# Patient Record
Sex: Female | Born: 1950
Health system: Southern US, Community
[De-identification: ages and names within clinical notes are randomized; demographics above are authoritative.]

## PROBLEM LIST (undated history)

## (undated) DIAGNOSIS — T8859XA Other complications of anesthesia, initial encounter: Secondary | ICD-10-CM

## (undated) DIAGNOSIS — E785 Hyperlipidemia, unspecified: Secondary | ICD-10-CM

## (undated) DIAGNOSIS — F39 Unspecified mood [affective] disorder: Secondary | ICD-10-CM

## (undated) DIAGNOSIS — M1990 Unspecified osteoarthritis, unspecified site: Secondary | ICD-10-CM

## (undated) DIAGNOSIS — R0602 Shortness of breath: Secondary | ICD-10-CM

## (undated) DIAGNOSIS — F32A Depression, unspecified: Secondary | ICD-10-CM

## (undated) DIAGNOSIS — J069 Acute upper respiratory infection, unspecified: Secondary | ICD-10-CM

## (undated) DIAGNOSIS — K219 Gastro-esophageal reflux disease without esophagitis: Secondary | ICD-10-CM

## (undated) DIAGNOSIS — E2839 Other primary ovarian failure: Secondary | ICD-10-CM

## (undated) DIAGNOSIS — Z8719 Personal history of other diseases of the digestive system: Secondary | ICD-10-CM

## (undated) DIAGNOSIS — Z720 Tobacco use: Secondary | ICD-10-CM

## (undated) DIAGNOSIS — T4145XA Adverse effect of unspecified anesthetic, initial encounter: Secondary | ICD-10-CM

## (undated) DIAGNOSIS — I739 Peripheral vascular disease, unspecified: Secondary | ICD-10-CM

## (undated) DIAGNOSIS — J449 Chronic obstructive pulmonary disease, unspecified: Secondary | ICD-10-CM

## (undated) DIAGNOSIS — F17200 Nicotine dependence, unspecified, uncomplicated: Secondary | ICD-10-CM

## (undated) DIAGNOSIS — R51 Headache: Secondary | ICD-10-CM

## (undated) DIAGNOSIS — F419 Anxiety disorder, unspecified: Secondary | ICD-10-CM

## (undated) DIAGNOSIS — Z5189 Encounter for other specified aftercare: Secondary | ICD-10-CM

## (undated) DIAGNOSIS — F329 Major depressive disorder, single episode, unspecified: Secondary | ICD-10-CM

## (undated) DIAGNOSIS — IMO0001 Reserved for inherently not codable concepts without codable children: Secondary | ICD-10-CM

## (undated) DIAGNOSIS — K259 Gastric ulcer, unspecified as acute or chronic, without hemorrhage or perforation: Secondary | ICD-10-CM

## (undated) HISTORY — DX: Nicotine dependence, unspecified, uncomplicated: F17.200

## (undated) HISTORY — DX: Other primary ovarian failure: E28.39

## (undated) HISTORY — PX: HERNIA REPAIR: SHX51

## (undated) HISTORY — PX: VARICOSE VEIN SURGERY: SHX832

## (undated) HISTORY — PX: LUMBAR DISC SURGERY: SHX700

## (undated) HISTORY — DX: Tobacco use: Z72.0

## (undated) HISTORY — PX: APPENDECTOMY: SHX54

## (undated) HISTORY — DX: Anxiety disorder, unspecified: F41.9

## (undated) HISTORY — DX: Unspecified mood (affective) disorder: F39

## (undated) HISTORY — PX: CHOLECYSTECTOMY: SHX55

## (undated) HISTORY — DX: Hyperlipidemia, unspecified: E78.5

## (undated) HISTORY — PX: HIP SURGERY: SHX245

## (undated) HISTORY — DX: Chronic obstructive pulmonary disease, unspecified: J44.9

## (undated) HISTORY — DX: Gastric ulcer, unspecified as acute or chronic, without hemorrhage or perforation: K25.9

---

## 1984-03-29 HISTORY — PX: TUBAL LIGATION: SHX77

## 2005-06-18 ENCOUNTER — Encounter (HOSPITAL_BASED_OUTPATIENT_CLINIC_OR_DEPARTMENT_OTHER): Admission: RE | Admit: 2005-06-18 | Discharge: 2005-08-03 | Payer: Self-pay | Admitting: Internal Medicine

## 2007-03-30 HISTORY — PX: GALLBLADDER SURGERY: SHX652

## 2011-08-06 ENCOUNTER — Encounter: Payer: Self-pay | Admitting: Internal Medicine

## 2011-08-09 ENCOUNTER — Ambulatory Visit (INDEPENDENT_AMBULATORY_CARE_PROVIDER_SITE_OTHER): Payer: BC Managed Care – PPO | Admitting: Internal Medicine

## 2011-08-09 ENCOUNTER — Encounter: Payer: Self-pay | Admitting: Internal Medicine

## 2011-08-09 VITALS — BP 124/82 | HR 72 | Temp 98.4°F | Ht 68.0 in | Wt 217.8 lb

## 2011-08-09 DIAGNOSIS — F17200 Nicotine dependence, unspecified, uncomplicated: Secondary | ICD-10-CM | POA: Insufficient documentation

## 2011-08-09 DIAGNOSIS — J449 Chronic obstructive pulmonary disease, unspecified: Secondary | ICD-10-CM | POA: Insufficient documentation

## 2011-08-09 HISTORY — DX: Nicotine dependence, unspecified, uncomplicated: F17.200

## 2011-08-09 NOTE — Assessment & Plan Note (Signed)
-   HFA 75% p coaching 08/09/2011    - 08/09/2011 Spirometry FEV1  1.71 (61%) ratio 59  GOLD II s/p recent flare of bronchitis doing much better since reduced smoking but really needs to stop completely for 2 weeks preop if at all possible  The proper method of use, as well as anticipated side effects, of a metered-dose inhaler are discussed and demonstrated to the patient. Improved effectiveness after extensive coaching during this visit to a level of approximately  75%  Reviewed optimal use of rescue rx as well  See instructions for specific recommendations which were reviewed directly with the patient who was given a copy with highlighter outlining the key components.

## 2011-08-09 NOTE — Assessment & Plan Note (Signed)
>   3 min discussion  Risks of preop smoking reviewed re effect on mucociliary function and risk of post op complications including but not limited to pneumonia/ atx and respirartory failure.

## 2011-08-09 NOTE — Patient Instructions (Signed)
Work on inhaler technique:  relax and gently blow all the way out then take a nice smooth deep breath back in, triggering the inhaler at same time you start breathing in.  Hold for up to 5 seconds if you can.  Rinse and gargle with water when done   If your mouth or throat starts to bother you,   I suggest you time the inhaler to your dental care and after using the inhaler(s) brush teeth and tongue with a baking soda containing toothpaste and when you rinse this out, gargle with it first to see if this helps your mouth and throat.     You need stop smoking completely for 2 weeks before surgery.  Only use your albuterol (Plan B is the combivent/ Plan C is the nebulizer)  as a rescue medication to be used if you can't catch your breath by resting or doing a relaxed purse lip breathing pattern. The less you use it, the better it will work when you need it.

## 2011-08-09 NOTE — Progress Notes (Signed)
Subjective:     Patient ID: Meagan Lamb, female   DOB: May 09, 1950 MRN: [id number]  HPI  15 yowf smoker with GOLD II COPD  works with mentally handicap referred by Dr Jeanie Sewer for preop eval for back surgery 08/09/2011    08/09/2011 1st pulmonary eval for doe x 3-4 years p gb surgery which did ok resp wise but "had a hard time" with Hernia repair at [facility] 2 years prior to OV  due to oversedation issues, not pulmonary complications.  Presently limited more by back and can walk slowly full  aisle at store like [employer] at  and some problem lying flat on back does better on L side due to sob. On symbicort 2 bid and did use duoneb for bad cough one month prior to OV > resolved and no longer neb or saba dependent.  Does have some asosc nasal congestion but no purulent sputum at present  Once asleep does ok without nocturnal  or early am exacerbation  of respiratory  c/o's or need for noct saba. Also denies any obvious fluctuation of symptoms with weather or environmental changes or other aggravating or alleviating factors except as outlined above.  Pain in back and right leg "getting intol" per pt > plans L2 laminectomy per Dr Shon Baton  Review of Systems     Objective:   Physical Exam Wt 217 08/09/2011    full dentures HEENT mild turbinate edema.  Oropharynx no thrush or excess pnd or cobblestoning.  No JVD or cervical adenopathy. No  accessory muscle hypertrophy. Trachea midline, nl thryroid. Chest was min hyperinflated by percussion with diminished breath sounds and mild increased exp time without wheeze. Hoover sign positive at late  inspiration. Regular rate and rhythm without murmur gallop or rub or increase P2 or edema.  Abd: no hsm, nl excursion. Ext warm without cyanosis or clubbing.   Assessment:          Plan:

## 2011-08-12 ENCOUNTER — Telehealth: Payer: Self-pay | Admitting: Internal Medicine

## 2011-08-12 NOTE — Telephone Encounter (Signed)
Called spoke with patient, advised her MW's recs re quitting smoking.  Pt was not pleased with these recs stating that she will not be able to go without any cigarettes for the next 2 weeks; she is under a great deal of stress.  Pt stated that she spoke with her PCP about smoking cessation and he was agreeable to her using nicotine patches and electronic cigarettes.  Pt (and daughter Meagan Lamb) are asking if this would be agreeable with MW and would she still be able to do this and be cleared for surgery (back surgery).    Also, called Meagan Lamb (surgery scheduler) at Dr Venita Lick' office to get the surgical clearance form refaxed to the triage fax number (received).  Meagan Lamb stated that d/t the surgery already being approved, they only have until the end of the month to get this scheduled and Dr Shon Baton only has 1 day left open for surgery with 3 patients pending for 4 hours surgeries.  Will be up to Dr Shon Baton to decide who can be done this month.  This is pertinent b/c pt may not be able to have her surgery this month regardless, and this is not an urgent surgery per Meagan Lamb - will just need to be pre-certified again.  Form placed in MW's lookat.  Message forwarded to MW and Meagan Lamb.  Pt requests this be done tomorrow w/ a call back.

## 2011-08-12 NOTE — Telephone Encounter (Signed)
Per Pt Dr. Dairl Ponder office faxed over a surgical clearance form for pt. She was seen on 08/06/11 and this was suppose to be taken care of then. Verlon Au have you seen form. Please advise thanks

## 2011-08-12 NOTE — Telephone Encounter (Signed)
LMTCB

## 2011-08-12 NOTE — Telephone Encounter (Signed)
Dr Sherene Sires, I have not seen the form, have you? Please advise and if not I will call Dr Dairl Ponder office and have them refax, thanks

## 2011-08-12 NOTE — Telephone Encounter (Signed)
Have not seen it but needs to off cigs for 2 weeks preop at a minimal then clear for surgery

## 2011-08-12 NOTE — Telephone Encounter (Signed)
We can clear her for surgery even if she doesn't quit, it's just that she is taking a big risk and needs to understand and accept this risk

## 2011-08-13 NOTE — Telephone Encounter (Signed)
Cordelia Pen called back-informed her of MW's advice and understands that patient is willing to take risk of not quitting smoking prior to surgery. Per Cordelia Pen Dr Shon Baton states he is okay if patient does not stop smoking. He will explain risks again to patient. Cordelia Pen stated she did get clearance from [facility] and will go ahead and schedule surgery for patient.

## 2011-08-13 NOTE — Telephone Encounter (Signed)
Pt aware of risks and is willing to accept that. Will await call from [facility] to inform her as well.

## 2011-08-13 NOTE — Telephone Encounter (Signed)
LMTCB for patient and Cordelia Pen at Dr Shon Baton' office.

## 2011-08-16 ENCOUNTER — Encounter (HOSPITAL_COMMUNITY): Payer: Self-pay | Admitting: Pharmacy Technician

## 2011-08-19 ENCOUNTER — Encounter (HOSPITAL_COMMUNITY): Payer: Self-pay

## 2011-08-19 ENCOUNTER — Inpatient Hospital Stay (HOSPITAL_COMMUNITY): Admission: RE | Admit: 2011-08-19 | Discharge: 2011-08-19 | Payer: BC Managed Care – PPO | Source: Ambulatory Visit

## 2011-08-19 HISTORY — DX: Depression, unspecified: F32.A

## 2011-08-19 HISTORY — DX: Major depressive disorder, single episode, unspecified: F32.9

## 2011-08-19 HISTORY — DX: Adverse effect of unspecified anesthetic, initial encounter: T41.45XA

## 2011-08-19 HISTORY — DX: Headache: R51

## 2011-08-19 HISTORY — DX: Reserved for inherently not codable concepts without codable children: IMO0001

## 2011-08-19 HISTORY — DX: Shortness of breath: R06.02

## 2011-08-19 HISTORY — DX: Acute upper respiratory infection, unspecified: J06.9

## 2011-08-19 HISTORY — DX: Other complications of anesthesia, initial encounter: T88.59XA

## 2011-08-19 HISTORY — DX: Encounter for other specified aftercare: Z51.89

## 2011-08-19 HISTORY — DX: Gastro-esophageal reflux disease without esophagitis: K21.9

## 2011-08-19 NOTE — Pre-Procedure Instructions (Signed)
20 ELMIRE AMREIN  08/19/2011   Your procedure is scheduled on:  08/26/11  Report to [facility] at 1000 AM.  Call this number if you have problems the morning of surgery: [phone]   Remember: Bring Incentive Spirometer back the day of surgery in your suitcase.   Do not eat food:After Midnight.  May have clear liquids: up to 4 Hours before arrival (6:00 AM).  Clear liquids include soda, tea, black coffee, apple or grape juice, broth.  Take these medicines the morning of surgery with A SIP OF WATER: Pain pill, Prilosec/Omeprazole, inhaler and nose spray   Do not wear jewelry, make-up or nail polish.  Do not wear lotions, powders, or perfumes. You may wear deodorant.  Do not shave 48 hours prior to surgery. Men may shave face and neck.  Do not bring valuables to the hospital.  Contacts, dentures or bridgework may not be worn into surgery.  Leave suitcase in the car. After surgery it may be brought to your room.  For patients admitted to the hospital, checkout time is 11:00 AM the day of discharge.   Patients discharged the day of surgery will not be allowed to drive home.    Special Instructions: CHG Shower Use Special Wash: 1/2 bottle night before surgery and 1/2 bottle morning of surgery.   Please read over the following fact sheets that you were given: Pain Booklet, Coughing and Deep Breathing, Blood Transfusion Information, MRSA Information and Surgical Site Infection Prevention

## 2011-08-19 NOTE — Progress Notes (Addendum)
LVM for Dr. Jeanie Sewer office medical records to request CXR patient states she had done there in last few months.  Note to Sherie Don to request from [facility] ov note, stress test and EKG patient states was done 08/10/11-states done for preop due to family hx.   Also patient states hx of difficulty waking up post op. Will ask Sherie Don to request last anesthesia records from [facility] surgery per patient about 3 1/2 yrs ago.    Victorino Dike at Dr. Jeanie Sewer office states no CXR in last year.

## 2011-08-20 ENCOUNTER — Encounter (HOSPITAL_COMMUNITY)
Admission: RE | Admit: 2011-08-20 | Discharge: 2011-08-20 | Disposition: A | Discharge status: Home / Self Care | Payer: BC Managed Care – PPO | Source: Ambulatory Visit | Attending: Orthopedic Surgery | Admitting: Orthopedic Surgery

## 2011-08-20 ENCOUNTER — Encounter (HOSPITAL_COMMUNITY)
Admission: RE | Admit: 2011-08-20 | Discharge: 2011-08-20 | Disposition: A | Discharge status: Home / Self Care | Payer: BC Managed Care – PPO | Source: Ambulatory Visit | Attending: Physician Assistant | Admitting: Physician Assistant

## 2011-08-20 LAB — CBC
HCT: 47.9 % — ABNORMAL HIGH (ref 36.0–46.0)
MCH: 33.3 pg (ref 26.0–34.0)
MCV: 99.2 fL (ref 78.0–100.0)
Platelets: 199 10*3/uL (ref 150–400)
RDW: 14.2 % (ref 11.5–15.5)
WBC: 7.6 10*3/uL (ref 4.0–10.5)

## 2011-08-20 LAB — PROTIME-INR: INR: 0.86 (ref 0.00–1.49)

## 2011-08-20 LAB — DIFFERENTIAL
Basophils Absolute: 0 10*3/uL (ref 0.0–0.1)
Basophils Relative: 1 % (ref 0–1)
Lymphocytes Relative: 33 % (ref 12–46)
Neutro Abs: 4.4 10*3/uL (ref 1.7–7.7)
Neutrophils Relative %: 59 % (ref 43–77)

## 2011-08-20 LAB — TYPE AND SCREEN
ABO/RH(D): O POS
Antibody Screen: NEGATIVE

## 2011-08-20 LAB — COMPREHENSIVE METABOLIC PANEL
Albumin: 4 g/dL (ref 3.5–5.2)
BUN: 12 mg/dL (ref 6–23)
CO2: 29 mEq/L (ref 19–32)
Calcium: 10 mg/dL (ref 8.4–10.5)
Chloride: 101 mEq/L (ref 96–112)
Creatinine, Ser: 0.75 mg/dL (ref 0.50–1.10)
GFR calc non Af Amer: >90 mL/min (ref >90)
Total Bilirubin: 0.3 mg/dL (ref 0.3–1.2)

## 2011-08-24 NOTE — Consult Note (Signed)
Anesthesia Chart Review:  Patient is a 61 year old female scheduled for XLIF L3-4 with posterior spinal fusion instrumentation L3-4 on 08/26/11.  History includes COPD, smoking, anxiety, asthma, mood disorder, SOB, headaches, depression, ovarian failure, GERD.  PCP is Dr. Gwendlyn Deutscher at [facility] in [place] who saw her for a pre-operative evaluation on 07/20/11. He recommended a pre-operative Pulmonology evaluation.  She was seen by Dr. Sandrea Hughs who cleared her for surgery, but with increased risk due to COPD and unwillingness to quit smoking.  EKG on 08/20/11 showed NSR, low voltage QRS.  Stress test done at [facility] (ordered by Dr. Jeanie Sewer) showed no evidence of ischemia, normal wall motion and thickening with EF > 65%, no diagnostic EKG changes of ischemia, occasional unifocal PVCs and transient bigeminy were noted during recovery.    CXR on 08/20/11 showed no active disease. Mild hyperinflation.   Spirometry report on 08/09/11 showed moderate airway obstruction with low vital capacity.    She reported having a hard time waking up after hernia surgery a few years ago at [facility].  I reviewed available records from a laparoscopic cholecystectomy and VHR on 09/15/07 and a repair of a incarcerated incisional hernia on 05/06/08.  In 2009 she awoke agitated and screaming and was treated with Dilaudid.  She had some oxygen desaturation that was treated with supplemental O2 by [place].  Dr. Thurston Hole noted suggest issue was oversedation and "not pulmonary complications."  Labs reviewed.  Plan to proceed.  Shonna Chock, PA-C

## 2011-08-24 NOTE — H&P (Signed)
Meagan Lamb 08/24/2011 9:17 AM Location: [facility] Patient #: [id number] DOB: 07/29/1950 Married / Language: English / Race: White Female   History of Present Illness(Meagan Brandi Dierdre Highman, PA-C; 08/24/2011 10:19 AM) The patient is a 61 year old female who comes in today for a preoperative History and Physical. The patient is scheduled for a XLIF with PAFI L3-4 (L3-4 spondylosis with right radicular leg pain) to be performed by Dr. Debria Garret D. Shon Baton, Meagan Lamb at [facility] on Thursday, Aug 26, 2011 at 78 NOON . Please see the hospital record for complete dictated history and physical.    Problem List/Past Medical(Meagan Belknap J [facility], PA-C; 08/24/2011 10:23 AM) Acid reflux (530.81)   Allergies(Meagan Lamb; 08/24/2011 9:18 AM) Insect Stings. Bees Codeine Sulfate *ANALGESICS - OPIOID* Morphine Sulfate *ANALGESICS - OPIOID* Penicillin VK *PENICILLINS* Sulfanomides Aspirin (Salicylates) Latex   Social History(Meagan Lamb; 08/24/2011 9:19 AM) Tobacco use. current every day smoker; smoke(d) 1 1/2 pack(s) per day   Medication History(Meagan Lamb; 08/24/2011 9:21 AM) Norco (5-325MG  Tablet, 1 (one) Oral q 8hrs prn pain, Taken starting 08/20/2011) Active. (called into pts pharmacy on file -djk) Ipratropium-Albuterol (0.5-2.5 (3)MG/3ML Solution, Inhalation) Active. Symbicort (160-4.5MCG/ACT Aerosol, Inhalation) Active. Combivent (18-103MCG/ACT Aerosol, Inhalation) Active. Omeprazole (20MG  Tablet DR, Oral) Active.   Past Surgical History(Meagan Lamb; 08/24/2011 9:23 AM) Hernia Repair. Laparoscopic. Appendectomy Carpal Tunnel Repair. right Gallbladder Surgery. laporoscopic Leg Circulation Surgery. bilateral Spinal Surgery Tonsillectomy Tubal Ligation   Other Problems(Meagan Sear J Lonn Im, PA-C; 08/24/2011 10:23 AM) Blood Clot Chronic Obstructive Lung Disease Chronic Pain Migraine Headache Ulcer disease Lumbar stenosis (724.02) Acid  Reflux   Review of Systems(Meagan Caul J Skylene Deremer, PA-C; 08/24/2011 10:27 AM) General:Present- Night Sweats and Fatigue. Not Present- Chills, Fever, Appetite Loss, Feeling sick, Weight Gain and Weight Loss. HEENT:Present- Sensitivity to light. Not Present- Hearing problems, Nose Bleed and Ringing in the Ears. Neck:Not Present- Swollen Glands and Neck Mass. Respiratory:Present- Chronic Cough and Dyspnea. Not Present- Snoring and Bloody sputum. Cardiovascular:Present- Shortness of Breath, Swelling of Extremities and Leg Cramps. Not Present- Chest Pain and Palpitations. Gastrointestinal:Present- Heartburn. Not Present- Bloody Stool, Abdominal Pain, Vomiting, Nausea and Incontinence of Stool. Female Genitourinary:Present- Frequency and Incontinence. Not Present- Blood in Urine, Menstrual Irregularities and Nocturia. Musculoskeletal:Present- Back Pain. Not Present- Muscle Weakness, Muscle Pain, Joint Stiffness, Joint Swelling and Joint Pain. Neurological:Present- Tingling, Numbness, Burning and Headaches. Not Present- Tremor and Dizziness. Psychiatric:Present- Anxiety. Not Present- Depression and Memory Loss. Endocrine:Present- Heat Intolerance. Not Present- Cold Intolerance, Excessive hunger and Excessive Thirst. Hematology:Present- Abnormal Bleeding and Easy Bruising. Not Present- Anemia and Blood Clots.   Vitals(Meagan Lamb; 08/24/2011 9:30 AM) 08/24/2011 9:30 AM Pulse: 75 (Regular)  08/24/2011 9:26 AM Weight: 219 lb Height: 66 in Body Surface Area: 2.15 m Body Mass Index: 35.35 kg/m BP: 115/70 (Sitting, Left Arm, Standard)    Physical Exam(Meagan Huttner J Adryel Wortmann, PA-C; 08/24/2011 11:40 AM) The physical exam findings are as follows:   General General Appearance- pleasant. Not in acute distress. Orientation- Oriented X3. Build & Nutrition- Well nourished and Well developed. Posture- Normal posture. Gait- Normal. Mental Status- Alert.   Integumentary Lumbar  Spine- Skin examination of the lumbar spine is without deformity, skin lesions, lacerations or abrasions.   Head and Neck Neck Global Assessment- supple. no lymphadenopathy and no nucchal rigidty.   Eye Pupil- Bilateral- Normal, Direct reaction to light normal, Equal and Regular. Motion- Bilateral- EOMI.   Chest and Lung Exam Auscultation: Breath sounds:- Clear.   Cardiovascular Auscultation:Rhythm- Regular rate and rhythm. Heart Sounds- Normal  heart sounds.   Abdomen Palpation/Percussion:Palpation and Percussion of the abdomen reveal - Non Tender, No Rebound tenderness and Soft.   Peripheral Vascular Lower Extremity:Inspection- Bilateral- Inspection Normal. Palpation:Posterior tibial pulse- Bilateral- 2+. Dorsalis pedis pulse- Bilateral- 2+.   Neurologic Sensation:Lower Extremity- Bilateral- sensation is intact in the lower extremity. Reflexes:Patellar Reflex- Bilateral- 1+. Achilles Reflex- Bilateral- 1+. Babinski- Bilateral- Babinski not present. Clonus- Bilateral- clonus not present. Hoffman's Sign- Bilateral- Hoffman's sign not present. Testing:Seated Straight Leg Raise- Bilateral- Seated straight leg raise negative.   Musculoskeletal Spine/Ribs/Pelvis Lumbosacral Spine:Inspection and Palpation- Tenderness- generalized. bony and soft tissue palpation of the lumbar spine and SI joint does not recreate their typical pain. Strength and Tone: Strength:Hip Flexion- Bilateral- 5/5. Knee Extension- Bilateral- 5/5. Knee Flexion- Bilateral- 5/5. Ankle Dorsiflexion- Bilateral- 5/5. Ankle Plantarflexion- Bilateral- 5/5. ROM- Flexion- full range of motion. Extension- full range of motion. Pain:- neither flexion or extension is more painful than the other. Waddell's Signs- no Waddell's signs present. Lower Extremity Range of Motion:- No true hip, knee or ankle pain with range of motion. Gait and Station:Assistive  Devices- cane (She is able to stand on her heels and her toes however she has pain with attempted mobility. She has some difficulty with heel-toe walking and requires something to hold.).   Assessment & Plan(Meagan Popov J Zarriah Starkel, PA-C; 08/24/2011 10:23 AM) Lumbar/Lumbosacral Disc Degeneration (722.52)  Failed back syndrome of lumbar spine (722.83)  Note: Unfortunately conservative measures consisting of observation, activity modification, physical therapy, oral pain medications and injection therapy have failed to alleviate her symptoms and given the ongoing nature of her pain and the significant decrease in her quality of life, she wishes to proceed with surgery. Risks/benefits/alternatives to surgery/expectations following the procedure have been reviewed with the patient by Dr. Shon Baton.  MRI of the lumbar spine dated 04/23/11 demonstrates L3-4 mild central canal stenosis and mild bilateral lateral recess stenosis without definite neural encroachment. Please see the scanned report in the patients chart for the remainder of the findings.   She has been medically cleared by both her pulmonologist, Dr. Sherene Sires and her PCP, Dr Jeanie Sewer. Please see the scanned documentation in the patients office chart. Recommendations by Dr. Sherene Sires were to stop smoking 2 weeks prior to procedure. Patient reports that she is working on it but has not yet stopped completely. She has been fitted for the corsett brace as well as the bone stimulator and she knows to bring these with her the morning of surgery. She has already completed her pre-op hospital requirements.   All of her questions have been encouraged, addressed and answered. We will plan on proceeding as scheduled.   Signed electronically by Meagan Maine, PA-C (08/24/2011 11:40 AM)    Meagan Lamb 07/13/2011 4:27 PM Location: [facility] Patient #: [id number] DOB: 11/14/1950 Married / Language: [place] / Race: White Female   History of  Present Illness(Meagan Lamb; 07/13/2011 4:29 PM) The patient is a 61 year old female who presents today for follow up of their back. The patient is being followed for their right-sided back pain and spasms. They are now 6 month(s) out from when symptoms began. The patient states that they are doing poorly. The following medication has been used for pain control: antiinflammatory medication and Tylenol. The patient reports their current pain level to be moderate to severe. The patient presents today following ESI. The patient has not gotten any relief of their symptoms with Cortisone injections.    Subjective Transcription(Meagan Sheela Stack, Meagan Lamb; 07/14/2011 5:26 PM)  She returns today for follow up. She states that the most recent injection done on May 01, 2011 which revealed a right L3 selective nerve root block for foraminal stenosis at L3-4 has produced significant pain relief. For about 4 days she was ambulating without her cane. She felt like a different woman, and she stated that things were, for the first time, positive. The symptoms have returned, and the injection has worn off, but at least from a diagnostic standpoint, I do believe that the L3-4 level is her pain generator. She has foraminal stenosis at this level with mild to moderate lateral recess and foraminal stenosis. There is no evidence of recurrent disc herniation or neural displacement at the L4-5 level.    Allergies(Meagan Lamb; 07/13/2011 4:29 PM) Codeine Sulfate *ANALGESICS - OPIOID* Morphine Sulfate *ANALGESICS - OPIOID* Penicillin VK *PENICILLINS* Sulfanomides Aspirin (Salicylates) Latex   Social History(Meagan Lamb; 07/13/2011 4:29 PM) Tobacco / smoke exposure. no   Medication History(Meagan Lamb; 07/13/2011 4:29 PM) PredniSONE (Pak) (10MG  Tablet, 1 Oral take as directed, Taken starting 05/04/2011) Active. Levofloxacin (750MG  Tablet, Oral) Active. TraZODone HCl (50MG   Tablet, Oral) Active. Omeprazole (20MG  Tablet DR, Oral) Active. Ipratropium-Albuterol (0.5-2.5 (3)MG/3ML Solution, Inhalation) Active. Symbicort (160-4.5MCG/ACT Aerosol, Inhalation) Active. Benzonatate (200MG  Capsule, Oral) Active. Combivent (18-103MCG/ACT Aerosol, Inhalation) Active. PredniSONE (Pak) (10MG  Tablet, Oral) Active.   Assessment & Plan(Lori Zipporah Plants; 07/13/2011 5:02 PM) Lumbar/Lumbosacral Disc Degeneration (722.52) Current Plans l Started Norco 5-325MG , 1 (one) Tablet q 8hrs prn pain, #90, 7 days starting 07/13/2011, No Refill.   Plans Transcription(Meagan Sheela Stack, Meagan Lamb; 07/14/2011 5:26 PM)  At this point and time, the patient's symptoms seem to be related to the L3-4 DDD. Although she has multi-level degenerative changes, the most prominent is the L3-4 level. Anesthetizing this level with the foraminal stenosis provided the relief that no other injection did before. She's got other issues at 5-1, but, again, injections at the 5-1 level did not provide any significant relief.    At this point, despite physical therapy, pain medication, activity modification and injection therapy, her quality of life continues to suffer and deteriorate. She is interested in a surgical solution to her pain. I have recommended a lateral interbody fusion. I think this would allow [place] to indirectly address the stenosis, address the foraminal compromise, and remove the painful disc and stabilize her spine. The risks of this surgery include infection, bleeding, nerve damage, death, stroke, paralysis, failure to heal, need for further surgery, ongoing or worse pain, loss of bowel or bladder control, injury to the lumbar plexus which can result in hip flexor weakness, difficulty walking and chronic pain.    At this point, given the failure of conservative management and the MRI findings and clinical findings and the relief with the recent 3-4 injection, I would recommend a fusion. She and  her daughter were present for the dictation. She has expressed an understanding of the risks and has expressed a desire to proceed with surgery. We will get approval from her PCP, medical clearance from her PCP, and once we have this, then we will proceed with the fusion surgery.    Because she does smoke, it does increase her risk of a nonunion. Given her age and nicotine use, I am going to request an external bone stimulator postoperatively to improve postoperatively.    Miscellaneous Transcription(Meagan Sheela Stack, Meagan Lamb; 07/14/2011 5:26 PM)  Venita Lick, M. D./slk    T: 07-14-11  D: 07-13-11      Signed  electronically by Alvy Beal, Meagan Lamb (07/13/2011 5:29 PM)

## 2011-08-25 MED ORDER — LACTATED RINGERS IV SOLN
INTRAVENOUS | Status: DC
  Administered 2011-08-26: 11:00:00 via INTRAVENOUS

## 2011-08-25 MED ORDER — VANCOMYCIN HCL IN DEXTROSE 1-5 GM/200ML-% IV SOLN
1000.0000 mg | INTRAVENOUS | Status: AC
  Administered 2011-08-26: 1000 mg via INTRAVENOUS
  Filled 2011-08-25: qty 200

## 2011-08-25 NOTE — Progress Notes (Signed)
Pt. Notified of time change ,message left for pt to arrive at 0930.

## 2011-08-26 ENCOUNTER — Encounter (HOSPITAL_COMMUNITY)
Admission: RE | Disposition: A | Discharge status: Home / Self Care | Payer: Self-pay | Source: Ambulatory Visit | Attending: Orthopedic Surgery

## 2011-08-26 ENCOUNTER — Encounter (HOSPITAL_COMMUNITY): Payer: Self-pay | Admitting: *Deleted

## 2011-08-26 ENCOUNTER — Encounter (HOSPITAL_COMMUNITY): Payer: Self-pay | Admitting: Vascular Surgery

## 2011-08-26 ENCOUNTER — Ambulatory Visit (HOSPITAL_COMMUNITY): Payer: BC Managed Care – PPO | Admitting: Vascular Surgery

## 2011-08-26 ENCOUNTER — Inpatient Hospital Stay (HOSPITAL_COMMUNITY)
Admission: RE | Admit: 2011-08-26 | Discharge: 2011-08-28 | DRG: 806 | Disposition: A | Discharge status: Home / Self Care | Payer: BC Managed Care – PPO | Source: Ambulatory Visit | Attending: Orthopedic Surgery | Admitting: Orthopedic Surgery

## 2011-08-26 ENCOUNTER — Ambulatory Visit (HOSPITAL_COMMUNITY): Payer: BC Managed Care – PPO

## 2011-08-26 DIAGNOSIS — K219 Gastro-esophageal reflux disease without esophagitis: Secondary | ICD-10-CM | POA: Diagnosis present

## 2011-08-26 DIAGNOSIS — J4489 Other specified chronic obstructive pulmonary disease: Secondary | ICD-10-CM | POA: Diagnosis present

## 2011-08-26 DIAGNOSIS — Z91038 Other insect allergy status: Secondary | ICD-10-CM

## 2011-08-26 DIAGNOSIS — G8929 Other chronic pain: Secondary | ICD-10-CM | POA: Diagnosis present

## 2011-08-26 DIAGNOSIS — Z91040 Latex allergy status: Secondary | ICD-10-CM

## 2011-08-26 DIAGNOSIS — M4120 Other idiopathic scoliosis, site unspecified: Secondary | ICD-10-CM | POA: Diagnosis present

## 2011-08-26 DIAGNOSIS — Z888 Allergy status to other drugs, medicaments and biological substances status: Secondary | ICD-10-CM

## 2011-08-26 DIAGNOSIS — Z882 Allergy status to sulfonamides status: Secondary | ICD-10-CM

## 2011-08-26 DIAGNOSIS — M5136 Other intervertebral disc degeneration, lumbar region: Secondary | ICD-10-CM

## 2011-08-26 DIAGNOSIS — M47817 Spondylosis without myelopathy or radiculopathy, lumbosacral region: Principal | ICD-10-CM | POA: Diagnosis present

## 2011-08-26 DIAGNOSIS — M51379 Other intervertebral disc degeneration, lumbosacral region without mention of lumbar back pain or lower extremity pain: Secondary | ICD-10-CM | POA: Diagnosis present

## 2011-08-26 DIAGNOSIS — F17200 Nicotine dependence, unspecified, uncomplicated: Secondary | ICD-10-CM | POA: Diagnosis present

## 2011-08-26 DIAGNOSIS — Z880 Allergy status to penicillin: Secondary | ICD-10-CM

## 2011-08-26 DIAGNOSIS — Z01812 Encounter for preprocedural laboratory examination: Secondary | ICD-10-CM

## 2011-08-26 DIAGNOSIS — G43909 Migraine, unspecified, not intractable, without status migrainosus: Secondary | ICD-10-CM | POA: Diagnosis present

## 2011-08-26 DIAGNOSIS — J449 Chronic obstructive pulmonary disease, unspecified: Secondary | ICD-10-CM | POA: Diagnosis present

## 2011-08-26 DIAGNOSIS — Z886 Allergy status to analgesic agent status: Secondary | ICD-10-CM

## 2011-08-26 DIAGNOSIS — M5137 Other intervertebral disc degeneration, lumbosacral region: Secondary | ICD-10-CM | POA: Diagnosis present

## 2011-08-26 HISTORY — DX: Unspecified osteoarthritis, unspecified site: M19.90

## 2011-08-26 HISTORY — PX: LUMBAR FUSION: SHX111

## 2011-08-26 HISTORY — DX: Personal history of other diseases of the digestive system: Z87.19

## 2011-08-26 HISTORY — PX: ANTERIOR LAT LUMBAR FUSION: SHX1168

## 2011-08-26 HISTORY — DX: Peripheral vascular disease, unspecified: I73.9

## 2011-08-26 SURGERY — ANTERIOR LATERAL LUMBAR FUSION 1 LEVEL
Anesthesia: General | Site: Flank | Wound class: Clean

## 2011-08-26 MED ORDER — MIDAZOLAM HCL 5 MG/5ML IJ SOLN
INTRAMUSCULAR | Status: DC | PRN
  Administered 2011-08-26: 2 mg via INTRAVENOUS

## 2011-08-26 MED ORDER — NALOXONE HCL 0.4 MG/ML IJ SOLN: 0.4000 mg | INTRAMUSCULAR | Status: DC | PRN

## 2011-08-26 MED ORDER — DIPHENHYDRAMINE HCL 12.5 MG/5ML PO ELIX: 12.5000 mg | ORAL_SOLUTION | Freq: Four times a day (QID) | ORAL | Status: DC | PRN

## 2011-08-26 MED ORDER — PHENOL 1.4 % MT LIQD: 1.0000 | OROMUCOSAL | Status: DC | PRN

## 2011-08-26 MED ORDER — ZOLPIDEM TARTRATE 10 MG PO TABS: 10.0000 mg | ORAL_TABLET | Freq: Every evening | ORAL | Status: DC | PRN

## 2011-08-26 MED ORDER — PROPOFOL 10 MG/ML IV EMUL
INTRAVENOUS | Status: DC | PRN
  Administered 2011-08-26: 50 ug/kg/min via INTRAVENOUS

## 2011-08-26 MED ORDER — DEXAMETHASONE 4 MG PO TABS
4.0000 mg | ORAL_TABLET | Freq: Four times a day (QID) | ORAL | Status: DC
  Administered 2011-08-26 – 2011-08-28 (×7): 4 mg via ORAL
  Filled 2011-08-26 (×11): qty 1

## 2011-08-26 MED ORDER — NICOTINE 14 MG/24HR TD PT24
14.0000 mg | MEDICATED_PATCH | Freq: Every day | TRANSDERMAL | Status: DC
  Administered 2011-08-26 – 2011-08-28 (×3): 14 mg via TRANSDERMAL
  Filled 2011-08-26 (×3): qty 1

## 2011-08-26 MED ORDER — FLEET ENEMA 7-19 GM/118ML RE ENEM: 1.0000 | ENEMA | Freq: Once | RECTAL | Status: AC | PRN

## 2011-08-26 MED ORDER — HYDROCODONE-ACETAMINOPHEN 10-325 MG PO TABS
1.0000 | ORAL_TABLET | ORAL | Status: DC | PRN
  Administered 2011-08-27 (×4): 1 via ORAL
  Administered 2011-08-28: 2 via ORAL
  Filled 2011-08-26 (×2): qty 2
  Filled 2011-08-26: qty 1
  Filled 2011-08-26: qty 2
  Filled 2011-08-26 (×2): qty 1

## 2011-08-26 MED ORDER — LACTATED RINGERS IV SOLN
INTRAVENOUS | Status: DC
  Administered 2011-08-27: 05:00:00 via INTRAVENOUS

## 2011-08-26 MED ORDER — DOCUSATE SODIUM 100 MG PO CAPS
100.0000 mg | ORAL_CAPSULE | Freq: Two times a day (BID) | ORAL | Status: DC
  Administered 2011-08-27 – 2011-08-28 (×3): 100 mg via ORAL
  Filled 2011-08-26 (×5): qty 1

## 2011-08-26 MED ORDER — LIDOCAINE HCL 4 % MT SOLN
OROMUCOSAL | Status: DC | PRN
  Administered 2011-08-26: 4 mL via TOPICAL

## 2011-08-26 MED ORDER — BUPIVACAINE-EPINEPHRINE 0.25% -1:200000 IJ SOLN
INTRAMUSCULAR | Status: DC | PRN
  Administered 2011-08-26: 10 mL

## 2011-08-26 MED ORDER — LACTATED RINGERS IV SOLN
INTRAVENOUS | Status: DC | PRN
  Administered 2011-08-26 (×2): via INTRAVENOUS

## 2011-08-26 MED ORDER — DEXAMETHASONE SODIUM PHOSPHATE 4 MG/ML IJ SOLN
4.0000 mg | Freq: Four times a day (QID) | INTRAMUSCULAR | Status: DC
  Filled 2011-08-26 (×11): qty 1

## 2011-08-26 MED ORDER — EPHEDRINE SULFATE 50 MG/ML IJ SOLN
INTRAMUSCULAR | Status: DC | PRN
  Administered 2011-08-26: 5 mg via INTRAVENOUS

## 2011-08-26 MED ORDER — SUCCINYLCHOLINE CHLORIDE 20 MG/ML IJ SOLN
INTRAMUSCULAR | Status: DC | PRN
  Administered 2011-08-26: 120 mg via INTRAVENOUS

## 2011-08-26 MED ORDER — VANCOMYCIN HCL IN DEXTROSE 1-5 GM/200ML-% IV SOLN
1000.0000 mg | Freq: Two times a day (BID) | INTRAVENOUS | Status: AC
  Administered 2011-08-26 – 2011-08-27 (×2): 1000 mg via INTRAVENOUS
  Filled 2011-08-26 (×2): qty 200

## 2011-08-26 MED ORDER — FENTANYL 10 MCG/ML IV SOLN
INTRAVENOUS | Status: DC
  Administered 2011-08-26: 20 ug via INTRAVENOUS
  Administered 2011-08-26: 18:00:00 via INTRAVENOUS
  Administered 2011-08-27: 30 ug via INTRAVENOUS
  Administered 2011-08-27: 10 ug via INTRAVENOUS
  Filled 2011-08-26 (×4): qty 50

## 2011-08-26 MED ORDER — PHENYLEPHRINE HCL 10 MG/ML IJ SOLN
INTRAMUSCULAR | Status: DC | PRN
  Administered 2011-08-26 (×5): 40 ug via INTRAVENOUS

## 2011-08-26 MED ORDER — SODIUM CHLORIDE 0.9 % IJ SOLN: 9.0000 mL | INTRAMUSCULAR | Status: DC | PRN

## 2011-08-26 MED ORDER — PANTOPRAZOLE SODIUM 40 MG PO TBEC
40.0000 mg | DELAYED_RELEASE_TABLET | Freq: Every day | ORAL | Status: DC
  Administered 2011-08-26 – 2011-08-27 (×2): 40 mg via ORAL
  Filled 2011-08-26 (×2): qty 1

## 2011-08-26 MED ORDER — PROPOFOL 10 MG/ML IV EMUL
INTRAVENOUS | Status: DC | PRN
  Administered 2011-08-26: 170 mg via INTRAVENOUS

## 2011-08-26 MED ORDER — ONDANSETRON HCL 4 MG/2ML IJ SOLN: 4.0000 mg | Freq: Four times a day (QID) | INTRAMUSCULAR | Status: DC | PRN

## 2011-08-26 MED ORDER — HYDROMORPHONE HCL PF 1 MG/ML IJ SOLN
0.2500 mg | INTRAMUSCULAR | Status: DC | PRN
  Administered 2011-08-26 (×2): 0.5 mg via INTRAVENOUS

## 2011-08-26 MED ORDER — ONDANSETRON HCL 4 MG/2ML IJ SOLN
INTRAMUSCULAR | Status: DC | PRN
  Administered 2011-08-26: 4 mg via INTRAVENOUS

## 2011-08-26 MED ORDER — ACETAMINOPHEN 10 MG/ML IV SOLN
1000.0000 mg | Freq: Once | INTRAVENOUS | Status: AC
  Administered 2011-08-26: 1000 mg via INTRAVENOUS
  Filled 2011-08-26: qty 100

## 2011-08-26 MED ORDER — IPRATROPIUM-ALBUTEROL 18-103 MCG/ACT IN AERO
2.0000 | INHALATION_SPRAY | RESPIRATORY_TRACT | Status: DC | PRN
  Filled 2011-08-26: qty 14.7

## 2011-08-26 MED ORDER — SODIUM CHLORIDE 0.9 % IJ SOLN
3.0000 mL | Freq: Two times a day (BID) | INTRAMUSCULAR | Status: DC
  Administered 2011-08-27 – 2011-08-28 (×2): 3 mL via INTRAVENOUS

## 2011-08-26 MED ORDER — SODIUM CHLORIDE 0.9 % IJ SOLN: 3.0000 mL | INTRAMUSCULAR | Status: DC | PRN

## 2011-08-26 MED ORDER — FLUTICASONE PROPIONATE 50 MCG/ACT NA SUSP
2.0000 | Freq: Every day | NASAL | Status: DC
  Administered 2011-08-27 – 2011-08-28 (×2): 2 via NASAL
  Filled 2011-08-26: qty 16

## 2011-08-26 MED ORDER — THROMBIN 20000 UNITS EX KIT
PACK | OROMUCOSAL | Status: DC | PRN
  Administered 2011-08-26: 14:00:00 via TOPICAL

## 2011-08-26 MED ORDER — BUDESONIDE-FORMOTEROL FUMARATE 160-4.5 MCG/ACT IN AERO
2.0000 | INHALATION_SPRAY | Freq: Two times a day (BID) | RESPIRATORY_TRACT | Status: DC
  Administered 2011-08-26 – 2011-08-27 (×3): 2 via RESPIRATORY_TRACT
  Filled 2011-08-26: qty 6

## 2011-08-26 MED ORDER — LIDOCAINE HCL (CARDIAC) 20 MG/ML IV SOLN
INTRAVENOUS | Status: DC | PRN
  Administered 2011-08-26: 80 mg via INTRAVENOUS

## 2011-08-26 MED ORDER — SODIUM CHLORIDE 0.9 % IV SOLN: 250.0000 mL | INTRAVENOUS | Status: DC

## 2011-08-26 MED ORDER — POLYETHYLENE GLYCOL 3350 17 G PO PACK
17.0000 g | PACK | Freq: Every day | ORAL | Status: DC
  Administered 2011-08-27 – 2011-08-28 (×2): 17 g via ORAL
  Filled 2011-08-26 (×3): qty 1

## 2011-08-26 MED ORDER — ONDANSETRON HCL 4 MG/2ML IJ SOLN: 4.0000 mg | INTRAMUSCULAR | Status: DC | PRN

## 2011-08-26 MED ORDER — METHOCARBAMOL 100 MG/ML IJ SOLN
500.0000 mg | Freq: Four times a day (QID) | INTRAVENOUS | Status: DC | PRN
  Administered 2011-08-26: 500 mg via INTRAVENOUS
  Filled 2011-08-26: qty 5

## 2011-08-26 MED ORDER — FENTANYL CITRATE 0.05 MG/ML IJ SOLN
INTRAMUSCULAR | Status: DC | PRN
  Administered 2011-08-26 (×4): 50 ug via INTRAVENOUS
  Administered 2011-08-26: 25 ug via INTRAVENOUS
  Administered 2011-08-26: 50 ug via INTRAVENOUS
  Administered 2011-08-26: 100 ug via INTRAVENOUS
  Administered 2011-08-26: 50 ug via INTRAVENOUS

## 2011-08-26 MED ORDER — MENTHOL 3 MG MT LOZG: 1.0000 | LOZENGE | OROMUCOSAL | Status: DC | PRN

## 2011-08-26 MED ORDER — DEXAMETHASONE SODIUM PHOSPHATE 10 MG/ML IJ SOLN
10.0000 mg | Freq: Once | INTRAMUSCULAR | Status: AC
  Administered 2011-08-26: 10 mg via INTRAVENOUS
  Filled 2011-08-26: qty 1

## 2011-08-26 MED ORDER — METHOCARBAMOL 500 MG PO TABS
500.0000 mg | ORAL_TABLET | Freq: Four times a day (QID) | ORAL | Status: DC | PRN
  Administered 2011-08-26 – 2011-08-27 (×3): 500 mg via ORAL
  Filled 2011-08-26 (×4): qty 1

## 2011-08-26 MED ORDER — DIPHENHYDRAMINE HCL 50 MG/ML IJ SOLN: 12.5000 mg | Freq: Four times a day (QID) | INTRAMUSCULAR | Status: DC | PRN

## 2011-08-26 MED ORDER — ALBUTEROL SULFATE HFA 108 (90 BASE) MCG/ACT IN AERS
INHALATION_SPRAY | RESPIRATORY_TRACT | Status: DC | PRN
  Administered 2011-08-26 (×2): 2 via RESPIRATORY_TRACT

## 2011-08-26 SURGICAL SUPPLY — 74 items
APPLIER CLIP 11 MED OPEN (CLIP)
ATTRACTOMAT 16X20 MAGNETIC DRP (DRAPES) ×2 IMPLANT
BLADE SURG 10 STRL SS (BLADE) ×2 IMPLANT
BLADE SURG ROTATE 9660 (MISCELLANEOUS) IMPLANT
CAGE COUGAR LS 18X12X55 (Cage) ×2 IMPLANT
CLIP APPLIE 11 MED OPEN (CLIP) IMPLANT
CLOTH BEACON ORANGE TIMEOUT ST (SAFETY) ×2 IMPLANT
CLSR STERI-STRIP ANTIMIC 1/2X4 (GAUZE/BANDAGES/DRESSINGS) ×4 IMPLANT
CORDS BIPOLAR (ELECTRODE) ×2 IMPLANT
COVER SURGICAL LIGHT HANDLE (MISCELLANEOUS) ×2 IMPLANT
DERMABOND ADVANCED (GAUZE/BANDAGES/DRESSINGS) ×1
DERMABOND ADVANCED .7 DNX12 (GAUZE/BANDAGES/DRESSINGS) ×1 IMPLANT
DRAPE C-ARM 42X72 X-RAY (DRAPES) ×2 IMPLANT
DRAPE ORTHO SPLIT 77X108 STRL (DRAPES) ×1
DRAPE POUCH INSTRU U-SHP 10X18 (DRAPES) ×2 IMPLANT
DRAPE SURG ORHT 6 SPLT 77X108 (DRAPES) ×1 IMPLANT
DRAPE U-SHAPE 47X51 STRL (DRAPES) ×4 IMPLANT
DRSG MEPILEX BORDER 4X8 (GAUZE/BANDAGES/DRESSINGS) ×4 IMPLANT
DURAPREP 26ML APPLICATOR (WOUND CARE) ×2 IMPLANT
ELECT BLADE 4.0 EZ CLEAN MEGAD (MISCELLANEOUS) ×2
ELECT CAUTERY BLADE 6.4 (BLADE) ×2 IMPLANT
ELECT REM PT RETURN 9FT ADLT (ELECTROSURGICAL) ×2
ELECTRODE BLDE 4.0 EZ CLN MEGD (MISCELLANEOUS) ×1 IMPLANT
ELECTRODE REM PT RTRN 9FT ADLT (ELECTROSURGICAL) ×1 IMPLANT
GAUZE SPONGE 4X4 16PLY XRAY LF (GAUZE/BANDAGES/DRESSINGS) ×2 IMPLANT
GLOVE BIOGEL PI IND STRL 6.5 (GLOVE) ×1 IMPLANT
GLOVE BIOGEL PI IND STRL 8.5 (GLOVE) ×1 IMPLANT
GLOVE BIOGEL PI INDICATOR 6.5 (GLOVE) ×1
GLOVE BIOGEL PI INDICATOR 8.5 (GLOVE) ×1
GLOVE ECLIPSE 6.0 STRL STRAW (GLOVE) IMPLANT
GLOVE ECLIPSE 8.5 STRL (GLOVE) IMPLANT
GLOVE SURG SS PI 6.0 STRL IVOR (GLOVE) ×4 IMPLANT
GLOVE SURG SS PI 8.0 STRL IVOR (GLOVE) ×4 IMPLANT
GOWN PREVENTION PLUS XXLARGE (GOWN DISPOSABLE) ×2 IMPLANT
GOWN STRL NON-REIN LRG LVL3 (GOWN DISPOSABLE) ×4 IMPLANT
GUIDEWIRE 1.6X480MM (WIRE) ×4 IMPLANT
GUIDEWIRE PIPELINE IS BLUNT (WIRE) ×2 IMPLANT
KIT BASIN OR (CUSTOM PROCEDURE TRAY) ×2 IMPLANT
KIT ORACLE NEUROMONITING (KITS) ×2 IMPLANT
KIT ROOM TURNOVER OR (KITS) ×2 IMPLANT
NEEDLE 22X1 1/2 (OR ONLY) (NEEDLE) ×2 IMPLANT
NEEDLE I-PASS III (NEEDLE) ×2 IMPLANT
NEEDLE SPNL 18GX3.5 QUINCKE PK (NEEDLE) ×2 IMPLANT
NS IRRIG 1000ML POUR BTL (IV SOLUTION) ×2 IMPLANT
PACK LAMINECTOMY ORTHO (CUSTOM PROCEDURE TRAY) ×2 IMPLANT
PACK UNIVERSAL I (CUSTOM PROCEDURE TRAY) ×2 IMPLANT
PAD ARMBOARD 7.5X6 YLW CONV (MISCELLANEOUS) ×4 IMPLANT
ROD MATRIX MIS 45MM (Rod) ×4 IMPLANT
SCREW MATRIX MIS 6.0X40MM (Screw) ×2 IMPLANT
SCREW MATRIX MIS 6.0X45MM (Screw) ×2 IMPLANT
SPONGE INTESTINAL PEANUT (DISPOSABLE) ×4 IMPLANT
SPONGE LAP 4X18 X RAY DECT (DISPOSABLE) ×2 IMPLANT
SPONGE SURGIFOAM ABS GEL 100 (HEMOSTASIS) ×2 IMPLANT
STRIP CLOSURE SKIN 1/2X4 (GAUZE/BANDAGES/DRESSINGS) IMPLANT
SURGIFLO TRUKIT (HEMOSTASIS) IMPLANT
SUT MNCRL AB 3-0 PS2 18 (SUTURE) ×2 IMPLANT
SUT PROLENE 5 0 C 1 24 (SUTURE) IMPLANT
SUT SILK 2 0 TIES 10X30 (SUTURE) ×2 IMPLANT
SUT SILK 3 0 TIES 10X30 (SUTURE) ×2 IMPLANT
SUT VIC AB 1 CT1 27 (SUTURE) ×2
SUT VIC AB 1 CT1 27XBRD ANBCTR (SUTURE) ×2 IMPLANT
SUT VIC AB 1 CTX 36 (SUTURE) ×2
SUT VIC AB 1 CTX36XBRD ANBCTR (SUTURE) ×2 IMPLANT
SUT VIC AB 2-0 CT1 18 (SUTURE) ×2 IMPLANT
SYR BULB IRRIGATION 50ML (SYRINGE) ×2 IMPLANT
SYR CONTROL 10ML LL (SYRINGE) ×2 IMPLANT
TAP CANN 5MM (TAP) ×2 IMPLANT
TAPE CLOTH 4X10 WHT NS (GAUZE/BANDAGES/DRESSINGS) ×4 IMPLANT
TOWEL OR 17X24 6PK STRL BLUE (TOWEL DISPOSABLE) ×2 IMPLANT
TOWEL OR 17X26 10 PK STRL BLUE (TOWEL DISPOSABLE) ×2 IMPLANT
TRAY FOLEY CATH 14FRSI W/METER (CATHETERS) ×2 IMPLANT
WATER STERILE IRR 1000ML POUR (IV SOLUTION) ×2 IMPLANT
WAVEGUIDE NARROW/FLAT LIGHT (MISCELLANEOUS) ×2 IMPLANT
locking cap ×4 IMPLANT

## 2011-08-26 NOTE — Transfer of Care (Signed)
Immediate Anesthesia Transfer of Care Note  Patient: Meagan Lamb  Procedure(s) Performed: Procedure(s) (LRB): ANTERIOR LATERAL LUMBAR FUSION 1 LEVEL (N/A)  Patient Location: PACU  Anesthesia Type: General  Level of Consciousness: awake, oriented and patient cooperative  Airway & Oxygen Therapy: Patient Spontanous Breathing and Patient connected to nasal cannula oxygen  Post-op Assessment: Report given to PACU RN and Post -op Vital signs reviewed and stable  Post vital signs: Reviewed  Complications: No apparent anesthesia complications

## 2011-08-26 NOTE — H&P (Signed)
No change in clinical exam H+P reviewed  

## 2011-08-26 NOTE — Plan of Care (Signed)
Problem: Phase I Progression Outcomes Goal: Voiding-avoid urinary catheter unless indicated Outcome: Not Met (add Reason) Foley Catheter to be removed POD #1

## 2011-08-26 NOTE — Anesthesia Preprocedure Evaluation (Addendum)
Anesthesia Evaluation  Patient identified by MRN, date of birth, ID band Patient awake    Reviewed: Allergy & Precautions, H&P , NPO status , Patient's Chart, lab work & pertinent test results  Airway Mallampati: II  Neck ROM: full    Dental  (+) Upper Dentures, Lower Dentures and Dental Advisory Given   Pulmonary shortness of breath and lying, asthma , COPD COPD inhaler, Recent URI ,  breath sounds clear to auscultation        Cardiovascular Rhythm:Regular Rate:Normal     Neuro/Psych  Headaches, PSYCHIATRIC DISORDERS Anxiety Depression    GI/Hepatic GERD-  Medicated and Controlled,  Endo/Other    Renal/GU      Musculoskeletal   Abdominal   Peds  Hematology   Anesthesia Other Findings   Reproductive/Obstetrics                          Anesthesia Physical Anesthesia Plan  ASA: III  Anesthesia Plan: General   Post-op Pain Management:    Induction: Intravenous  Airway Management Planned: Oral ETT  Additional Equipment:   Intra-op Plan:   Post-operative Plan: Extubation in OR  Informed Consent: I have reviewed the patients History and Physical, chart, labs and discussed the procedure including the risks, benefits and alternatives for the proposed anesthesia with the patient or authorized representative who has indicated his/her understanding and acceptance.     Plan Discussed with: CRNA and Surgeon  Anesthesia Plan Comments:         Anesthesia Quick Evaluation

## 2011-08-26 NOTE — Preoperative (Signed)
Beta Blockers   Reason not to administer Beta Blockers:Not Applicable 

## 2011-08-26 NOTE — Anesthesia Postprocedure Evaluation (Signed)
  Anesthesia Post-op Note  Patient: Meagan Lamb  Procedure(s) Performed: Procedure(s) (LRB): ANTERIOR LATERAL LUMBAR FUSION 1 LEVEL (N/A)  Patient Location: PACU  Anesthesia Type: General  Level of Consciousness: awake, alert  and oriented  Airway and Oxygen Therapy: Patient Spontanous Breathing and Patient connected to nasal cannula oxygen  Post-op Pain: mild  Post-op Assessment: Post-op Vital signs reviewed, Patient's Cardiovascular Status Stable, Respiratory Function Stable, Patent Airway, No signs of Nausea or vomiting and Pain level controlled  Post-op Vital Signs: Reviewed and stable  Complications: No apparent anesthesia complications

## 2011-08-26 NOTE — Op Note (Signed)
NAMESHERRYLL, SKOCZYLAS NO.:  [id number]  MEDICAL RECORD NO.:  [id number]  LOCATION:  5023                         FACILITY:  [facility]  PHYSICIAN:  Alvy Beal, MD    DATE OF BIRTH:  1950-07-29  DATE OF PROCEDURE:  08/26/2011 DATE OF DISCHARGE:                              OPERATIVE REPORT   PREOPERATIVE DIAGNOSIS:  Degenerative lumbar scoliosis with spinal stenosis L3-4.  POSTOPERATIVE DIAGNOSIS:  Degenerative lumbar scoliosis with spinal stenosis L3-4.  OPERATIVE PROCEDURE: 1. Anterior lateral complete diskectomy and interbody fusion with     biomechanical device. 2. Posterolateral arthrodesis L3-4 with DBX mixed bone graft. 3. Posterolateral segmental instrumentation with unilateral pedicle     screw fixation L3-4.  COMPLICATIONS:  None.  CONDITION:  Stable.  Intervertebral device used was carbon fiber, DePuy ALIF cage, 18 x 12 lordotic spacer packed with DBX mix.  Pedicle screw system used is the Synthes MIS matrix pedicle screw system.  HISTORY:  This is a very pleasant woman who has been complaining of severe back, buttock, and anterior thigh pain.  Radiographic and clinical analysis confirmed the diagnosis.  The patient had excellent relief of her symptoms with isolated L3 selective nerve root block. This was only temporary.  As a result of the failure of conservative management, she elected to proceed with surgery.  All appropriate risks, benefits, and alternatives were discussed with the patient.  Consent was obtained.  OPERATIVE NOTE:  The patient was brought to the operating room, placed supine on the operating table.  After successful induction of general anesthesia, endotracheal intubation, TEDs, SCDs, and Foley were inserted.  At this point, all appropriate neuromonitoring needles were placed by the [employer] representative and the patient was gently turned into the lateral decubitus position left side up.  All bony prominences were  well padded.  Axillary roll was placed and the patient was taped and secured to the table firmly.  The table was then broken and the patient was positioned for the lateral approach.  AP and lateral x-rays confirmed satisfactory positioning.  At this point, the flank and back was prepped and draped in a standard fashion.  A time-out was done to confirm patient, procedure, and all other pertinent important data.  Marker was placed on top of the skin and an x-ray was taken, which confirmed the superior, inferior, anterior, and posterior margins of the L3-4 disk space.  The incision was infiltrated with Marcaine.  The incision was made, centered over the disk space.  Sharp dissection was carried out down to the deep fascia.  The fascia of the external oblique was incised to expose the fibers of the external oblique.  A 2nd incision was made approximately 1 fingerbreadth posterior to the 1st. This was a small incision to allow my finger to pass down to the posterior fascia of the retroperitoneal space.  I then bluntly dissected through that until I could palpate the surface of the psoas muscle. Once I was able to palpate the psoas muscle, I began mobilizing the structures in the retroperitoneal space in order to create the void for the trocars to pass.  I then advanced my finger up to the undersurface of the  external oblique and then bluntly dissected through it until I could see my finger in the 1st wound.  I then placed the trocar on top of this and guided it down to the surface of the psoas muscle.  I then stimulated in a circumferential fashion until I identified the location of the plexus.  Once I had the lumbar plexus identified, I repositioned my trocar, centered it over the L3-4 disk space, and advanced it through the psoas muscle.  I then placed guide pin to hold it in place.  I then sequentially dilated each time stimulating in a circumferential fashion to confirm that there was no  trauma to the lumbar plexus.  Once I had the final dilator in place, I placed the retracting device and secured it to the table.  I then confirmed that the plexus was posterior and inferior to the retractor and then I secured the retractor with the trocar into the disk space at L3-4.  Once I had the retractor properly positioned, I then confirmed satisfactory position of the retractor in the AP and lateral planes.  Once this was done, I incised the disk space with a #10 blade scalpel and then using a combination of pituitary rongeurs, curettes, and Kerrison rongeurs, I removed all the disk material.  I then used a Cobb elevator to release the anulus from the contralateral side.  I then used shavers to further remove disk material.  I then used raspers to ensure I had bleeding subchondral bone.  I then trialed with the 10 and 12 spacer.  I felt the 12 spacer provided the best fit.  I then obtained the appropriate size implant, packed it with DBX mix, irrigated the wound copiously with normal saline and then malleted the cage to the appropriate spot.  Once the cage was properly positioned and centered, I then irrigated the wound copiously with normal saline.  I then removed the retractor and obtaining hemostasis using bipolar electrocautery.  I then closed the fascia of the external oblique with a #1 Vicryl suture, then a 0 interrupted Vicryl suture, and then a 2-0 interrupted Vicryl suture for the subcutaneous, and a 3-0 Monocryl for the skin.  I closed the 2nd small incision posteriorly in the same fashion.  At this point, once the lateral fusion was complete, I leveled the bed and then identified the lateral aspect of the left L3 pedicle.  Once this was done, I made a small incision and advanced the Jamshidi needle through this down to the lateral aspect of the L3 pedicle.  Once I confirmed satisfactory position in the AP and lateral planes, I then malleted the Jamshidi needle into the  pedicle.  This was done using fluoroscopic guidance as well as using neuro monitoring.  At no point was there any free running EMGs or evidence of breach and nerve compression.  Once the Jamshidi was properly positioned, I placed a guide pin through the Jamshidi.  I repeated this entire procedure.  Once this was done, I then tapped over the guide pin and then placed a 40 mm long 6.0 diameter pedicle screw over the guide pin.  I had excellent purchase of the screw.  I then repeated this entire procedure at the L4 level but this time I used a 45 mm length screw.  X-rays at this point demonstrated satisfactory position of the unilateral pedicle screw construct.  I then obtained a 45 mm rod and then advanced it down and secured it to the screw heads.  The locking nuts were torqued appropriately.  I then decorticated the transverse processes with a Cobb elevator and placed the remaining bone graft in the posterolateral aspect of the posterolateral gutter.  I then removed the retracting devices, closed the deep fascia with interrupted #1 Vicryl suture, superficial 2-0 Vicryl suture, 3-0 Monocryl for the skin.  Steri-Strips, dry dressings were applied to all wounds.  The patient was extubated, transferred to PACU without incident.  At the end of the case, all needle and sponge counts were correct.  There were no adverse intraoperative events during the course of the procedure.     Alvy Beal, MD     DDB/MEDQ  D:  08/26/2011  T:  08/26/2011  Job:  [id number]

## 2011-08-26 NOTE — Brief Op Note (Signed)
08/26/2011  4:02 PM  PATIENT:  Meagan Lamb  61 y.o. female  PRE-OPERATIVE DIAGNOSIS:  L3-4 SPONDYLOSIS WITH RADICULAR LEG PAIN  POST-OPERATIVE DIAGNOSIS:  L3-4 SPONDYLOSIS WITH RADICULAR LEG PAIN  PROCEDURE:  Procedure(s) (LRB): ANTERIOR LATERAL LUMBAR FUSION 1 LEVEL (N/A)  SURGEON:  Surgeon(s) and Role:    * Venita Lick, MD - Primary  PHYSICIAN ASSISTANT:   ASSISTANTS: Norval Gable   ANESTHESIA:   general  EBL:  Total I/O In: 1000 [I.V.:1000] Out: 525 [Urine:475; Blood:50]  BLOOD ADMINISTERED:none  DRAINS: none   LOCAL MEDICATIONS USED:  MARCAINE     SPECIMEN:  No Specimen  DISPOSITION OF SPECIMEN:  N/A  COUNTS:  YES  TOURNIQUET:  * No tourniquets in log *  DICTATION: .Other Dictation: Dictation Number [id number]  PLAN OF CARE: Admit to inpatient   PATIENT DISPOSITION:  PACU - hemodynamically stable.

## 2011-08-27 ENCOUNTER — Encounter (HOSPITAL_COMMUNITY): Payer: Self-pay | Admitting: General Practice

## 2011-08-27 ENCOUNTER — Inpatient Hospital Stay (HOSPITAL_COMMUNITY): Payer: BC Managed Care – PPO

## 2011-08-27 MED ORDER — POLYETHYLENE GLYCOL 3350 17 G PO PACK: 17.0000 g | PACK | Freq: Every day | ORAL | Status: AC

## 2011-08-27 MED ORDER — OXYCODONE-ACETAMINOPHEN 10-325 MG PO TABS: 1.0000 | ORAL_TABLET | Freq: Four times a day (QID) | ORAL | Status: AC | PRN

## 2011-08-27 MED ORDER — ONDANSETRON HCL 4 MG PO TABS: 4.0000 mg | ORAL_TABLET | Freq: Three times a day (TID) | ORAL | Status: AC | PRN

## 2011-08-27 MED ORDER — METHOCARBAMOL 500 MG PO TABS: 500.0000 mg | ORAL_TABLET | Freq: Three times a day (TID) | ORAL | Status: AC

## 2011-08-27 NOTE — Evaluation (Signed)
Physical Therapy Evaluation Patient Details Name: Meagan Lamb MRN: [id number] DOB: 10-Feb-1951 Today's Date: 08/27/2011 Time: 0824-0901 PT Time Calculation (min): 37 min  PT Assessment / Plan / Recommendation Clinical Impression  Meagan Lamb is POD #1 lumbar fusion L3-4. Mobility llimited by pain and new back precautions. Will benefit physical therapy in the acute setting to address these and the below problem list so as to maximize mobility and independence for safe d/c home. Pending progress pt may benefit from HHPT vs no f/u. Rec RW for d/c.  Following session pt able to verbalize 2/3 back precautions with visual cue for the 3rd. Focus of session on education concerning mobility within safe range. Back precaution hand out supplied.     PT Assessment  Patient needs continued PT services    Follow Up Recommendations  Home health PT vs No PT follow up (will determine Saturday)   Barriers to Discharge        lEquipment Recommendations  Rolling walker with 5" wheels    Recommendations for Other Services OT consult   Frequency Min 5X/week    Precautions / Restrictions Precautions Precautions: Back Required Braces or Orthoses: Spinal Brace Spinal Brace: Applied in sitting position;Applied in standing position Restrictions Other Position/Activity Restrictions: Per Meagan Lamb (Dr. Shon Baton PA, pt can don brace sitting/standing), but brace on when OOB         Mobility  Bed Mobility Bed Mobility: Rolling Right;Right Sidelying to Sit Rolling Right: 5: Supervision Right Sidelying to Sit: 5: Supervision;HOB flat Details for Bed Mobility Assistance: cues for sequencing and back precautions Transfers Transfers: Sit to Stand;Stand to Sit Sit to Stand: 4: Min guard;With upper extremity assist;From bed Stand to Sit: 4: Min guard;With upper extremity assist;To chair/3-in-1 Details for Transfer Assistance: cues for adherence to back precautions and close gaurding, pt slow to rise using sink in  front of her to assist; cues for slow descent to chair Ambulation/Gait Ambulation/Gait Assistance: 4: Min guard;5: Supervision Ambulation Distance (Feet): 90 Feet Assistive device: Rolling walker Ambulation/Gait Assistance Details: amb with slow gait and flexed trunk, cues for upright posture and back precautions especially during turns    Exercises     PT Diagnosis: Difficulty walking;Acute pain  PT Problem List: Decreased activity tolerance;Decreased knowledge of precautions;Pain;Decreased mobility PT Treatment Interventions: DME instruction;Gait training;Stair training;Functional mobility training;Therapeutic activities;Patient/family education;Neuromuscular re-education   PT Goals Acute Rehab PT Goals PT Goal Formulation: With patient Time For Goal Achievement: 09/03/11 Pt will Roll Supine to Right Side: with modified independence PT Goal: Rolling Supine to Right Side - Progress: Goal set today Pt will Roll Supine to Left Side: with modified independence PT Goal: Rolling Supine to Left Side - Progress: Goal set today Pt will go Supine/Side to Sit: with modified independence PT Goal: Supine/Side to Sit - Progress: Goal set today Pt will go Sit to Supine/Side: with modified independence PT Goal: Sit to Supine/Side - Progress: Goal set today Pt will go Sit to Stand: with modified independence PT Goal: Sit to Stand - Progress: Goal set today Pt will go Stand to Sit: with modified independence PT Goal: Stand to Sit - Progress: Goal set today Pt will Transfer Bed to Chair/Chair to Bed: with modified independence PT Transfer Goal: Bed to Chair/Chair to Bed - Progress: Goal set today Pt will Ambulate: >150 feet;with modified independence;with least restrictive assistive device PT Goal: Ambulate - Progress: Goal set today Pt will Go Up / Down Stairs: 1-2 stairs;with min assist;with rail(s) PT Goal: Up/Down Stairs - Progress:  Goal set today Additional Goals Additional Goal #1: Pt will  verbalize and demonstrate understanding of 3/3 back precautions.  PT Goal: Additional Goal #1 - Progress: Goal set today  Visit Information  Last PT Received On: 08/27/11 Assistance Needed: +1    Subjective Data  Subjective: My leg pain is gone, its only painful where he did the surgery.  Patient Stated Goal: home   Prior Functioning  Home Living Lives With: Spouse;Daughter Available Help at Discharge: Family;Available 24 hours/day (for the first week) Type of Home: House Home Access: Stairs to enter [employer] of Steps: 1 Entrance Stairs-Rails: Right Home Layout: One level Bathroom Shower/Tub: Engineer, manufacturing systems: Standard Home Adaptive Equipment: Straight cane Prior Function Level of Independence: Independent Communication Communication: No difficulties    Cognition  Overall Cognitive Status: Appears within functional limits for tasks assessed/performed Arousal/Alertness: Awake/alert Orientation Level: Appears intact for tasks assessed Behavior During Session: [facility] for tasks performed    Extremity/Trunk Assessment Right Upper Extremity Assessment RUE ROM/Strength/Tone: Within functional levels RUE Sensation: WFL - Light Touch;WFL - Proprioception RUE Coordination: WFL - gross/fine motor Left Upper Extremity Assessment LUE ROM/Strength/Tone: Within functional levels LUE Sensation: WFL - Light Touch;WFL - Proprioception LUE Coordination: WFL - gross/fine motor Right Lower Extremity Assessment RLE ROM/Strength/Tone: Within functional levels RLE Sensation: WFL - Light Touch;WFL - Proprioception RLE Coordination: WFL - gross/fine motor Left Lower Extremity Assessment LLE ROM/Strength/Tone: Within functional levels LLE Sensation: WFL - Light Touch;WFL - Proprioception LLE Coordination: WFL - gross/fine motor Trunk Assessment Trunk Assessment: Normal   Balance    End of Session PT - End of Session Equipment Utilized During Treatment: Gait  belt Activity Tolerance: Patient tolerated treatment well Patient left: in chair;with call bell/phone within reach Nurse Communication: Mobility status   Meagan Lamb 08/27/2011, 9:13 AM

## 2011-08-27 NOTE — Progress Notes (Signed)
Referral received for SNF. Chart reviewed and CSW has spoken with RNCM who indicates that patient is for DC to home with Home Health and DME.  CSW to sign off. Please re-consult if CSW needs arise.  Sundee Garland T. Dequavious Harshberger, MSW  [phone]  

## 2011-08-27 NOTE — Progress Notes (Signed)
CARE MANAGEMENT NOTE 08/27/2011  Patient:  Meagan Lamb, Meagan Lamb   Account Number:  [id number]  Date Initiated:  08/27/2011  Documentation initiated by:  Vance Peper  Subjective/Objective Assessment:   61 yr old female s/p L3-4 anterior discectomy and fusion.     Action/Plan:   Spoke with patient regarding home health needs and DME. Choice offered.Entered in [facility]. [facility]oke with [facility] for rolling walker.   Anticipated DC Date:  08/28/2011   Anticipated DC Plan:  HOME W HOME HEALTH SERVICES      DC Planning Services  CM consult      PAC Choice  DURABLE MEDICAL EQUIPMENT  HOME HEALTH   Choice offered to / List presented to:  C-1 Patient   DME arranged  Levan Hurst      DME agency  [facility]     [facility] arranged  HH-3 OT  HH-2 PT      [facility] agency  [facility]   Status of service:  Completed, signed off  Discharge Disposition:  HOME W HOME HEALTH SERVICES

## 2011-08-27 NOTE — Progress Notes (Signed)
UR COMPLETED  

## 2011-08-27 NOTE — Discharge Summary (Signed)
Patient ID: RHYANNA SORCE MRN: [id number] DOB/AGE: March 22, 1951 61 y.o.  Admit date: 08/26/2011 Discharge date: 08/28/2011   Admission Diagnoses:  Degenerative lumbar scoliosis with spinal stenosis L3-4.  Discharge Diagnoses:  Degenerative lumbar scoliosis with spinal stenosis L3-4 s/p OPERATIVE PROCEDURE:  1. Anterior lateral complete diskectomy and interbody fusion with  biomechanical device.  2. Posterolateral arthrodesis L3-4 with DBX mixed bone graft.  3. Posterolateral segmental instrumentation with unilateral pedicle  screw fixation L3-4.   Past Medical History  Diagnosis Date  . COPD (chronic obstructive pulmonary disease)   . Anxiety   . Mood disorder   . Tobacco abuse   . Ovarian failure   . Complication of anesthesia     "takes her a long time to come out of it"  . Asthma   . Shortness of breath     at times per patient  . Recurrent upper respiratory infection (URI)     bronchitis March or April  . Blood transfusion     at 31 months old  . GERD (gastroesophageal reflux disease)   . Headache     reoccuring migranes  . Depression     Surgeries: Procedure(s): ANTERIOR LATERAL LUMBAR FUSION 1 LEVEL on 08/26/2011   Consultants: none  Discharged Condition: Improved  Hospital Course: TARNISHA KACHMAR is an 61 y.o. female who was admitted 08/26/2011 for operative treatment of degenerative lumbar scoliosis with spinal stenosis L3-4.  Patient failed conservative treatments (please see the history and physical for the specifics) and had severe unremitting pain that affects sleep, daily activities, and work/hobbies. After pre-op clearance the patient was taken to the operating room on 08/26/2011 and underwent  Procedure(s): ANTERIOR LATERAL LUMBAR FUSION 1 LEVEL.    Patient was given perioperative antibiotics: Anti-infectives     Start     Dose/Rate Route Frequency Ordered Stop   08/26/11 2200   vancomycin (VANCOCIN) IVPB 1000 mg/200 mL premix        1,000 mg 200  mL/hr over 60 Minutes Intravenous Every 12 hours 08/26/11 1847 08/27/11 2159   08/25/11 1432   vancomycin (VANCOCIN) IVPB 1000 mg/200 mL premix        1,000 mg 200 mL/hr over 60 Minutes Intravenous 60 min pre-op 08/25/11 1432 08/26/11 1307           Patient was given sequential compression devices and early ambulation to prevent DVT.   Patient benefited maximally from hospital stay and there were no complications. At the time of discharge, the patient was moving their bowels without difficulty, tolerating a regular diet, pain is controlled with oral pain medications and they have been cleared by PT/OT.   Recent vital signs: Patient Vitals for the past 24 hrs:  BP Temp Temp src Pulse Resp SpO2  08/27/11 0602 138/77 mmHg 97.7 F (36.5 C) - 74  18  98 %  08/27/11 0400 - - - - 13  97 %  08/27/11 0208 143/70 mmHg 97.1 F (36.2 C) - 85  18  98 %  08/27/11 0000 - - - - 18  96 %  08/26/11 2140 147/69 mmHg 97.8 F (36.6 C) Oral - 18  99 %  08/26/11 2027 - - - - - 98 %  08/26/11 2000 - - - - 14  98 %  08/26/11 1830 126/83 mmHg 97.6 F (36.4 C) Oral 81  16  96 %  08/26/11 1813 131/82 mmHg - - 83  16  97 %  08/26/11 1800 - 97 F (36.1 C) - - - -  08/26/11 1758 142/85 mmHg - - 80  15  98 %  08/26/11 1743 138/73 mmHg - - 81  10  98 %  08/26/11 1728 136/78 mmHg - - 83  16  93 %  08/26/11 1714 126/61 mmHg - - 79  7  96 %  08/26/11 1658 146/83 mmHg - - 85  12  97 %  08/26/11 1643 144/79 mmHg - - 90  19  97 %  08/26/11 1628 145/76 mmHg - - 90  20  92 %  08/26/11 1625 - 97.2 F (36.2 C) - - - 95 %  08/26/11 0938 114/74 mmHg 97.9 F (36.6 C) Oral 69  18  95 %     Recent laboratory studies: No results found for this basename: WBC:2,HGB:2,HCT:2,PLT:2,NA:2,K:2,CL:2,CO2:2,BUN:2,CREATININE:2,GLUCOSE:2,PT:2,INR:2,CALCIUM,2: in the last 72 hours   Discharge Medications:   Medication List  As of 08/27/2011  8:03 AM   ASK your doctor about these medications         COMBIVENT 18-103 MCG/ACT  inhaler   Generic drug: albuterol-ipratropium   Take 2 puffs by mouth every 4 (four) hours as needed. Shortness of breath      fluticasone 50 MCG/ACT nasal spray   Commonly known as: FLONASE   Place 2 sprays into both nostrils Daily.      HYDROcodone-acetaminophen 5-325 MG per tablet   Commonly known as: NORCO   Take 1 tablet by mouth Every 8 hours. As needed for pain      omeprazole 20 MG capsule   Commonly known as: PRILOSEC   Take 1 capsule by mouth daily.      SYMBICORT 160-4.5 MCG/ACT inhaler   Generic drug: budesonide-formoterol   Inhale 2 puffs into the lungs 2 (two) times daily.            Diagnostic Studies:  Chest 2 View  08/20/2011  *RADIOLOGY REPORT*  Clinical Data: Preop, smoker  CHEST - 2 VIEW  Comparison: 04/19/2009  Findings: Cardiomediastinal silhouette is unremarkable.  Mild hyperinflation.  No acute infiltrate or pleural effusion.  No pulmonary edema.  Mild lower thoracic dextroscoliosis.  IMPRESSION: No active disease.  Mild hyperinflation.  Original Report Authenticated By: Natasha Mead, M.D.   Dg Lumbar Spine 2-3 Views  08/26/2011  *RADIOLOGY REPORT*  Clinical Data: L3-4 fusion.  LUMBAR SPINE - 2-3 VIEW  Comparison: Lumbar spine radiographs dated 08/20/2011.  Findings: Two PA and two lateral C-arm views of the lumbar spine demonstrate interval interbody bone plug and left pedicle screw and rod fixation at the L3-4 level with normal alignment.  Overlying hernia repair mesh anchors are again demonstrated.  IMPRESSION: Interval fusion at the L3-4 level with normal alignment.  Original Report Authenticated By: Darrol Angel, M.D.   Dg Lumbar Spine 2-3 Views  08/20/2011  *RADIOLOGY REPORT*  Clinical Data: Preop  LUMBAR SPINE - 2-3 VIEW  Comparison: Sagittal images CT scan 07/09/2010  Findings: Two views of the lumbar spine submitted.  Post hernia repair changes noted abdominal wall.  Post cholecystectomy surgical clips are noted.  No acute fracture or subluxation.  Mild  levoscoliosis.  Again noted disc space flattening with mild anterior spurring at T12-L1 level. Multilevel facet degenerative changes again noted.  IMPRESSION: No acute fracture or subluxation.  Mild levoscoliosis. Degenerative changes T12-L1 level.  Original Report Authenticated By: Natasha Mead, M.D.        Discharge Plan:  discharge to Home with home health  Disposition: STABLE at the time of discharge    Signed: Norval Gable  J 08/27/2011, 8:03 AM

## 2011-08-27 NOTE — Progress Notes (Signed)
Occupational Therapy Evaluation Patient Details Name: Meagan Lamb MRN: [id number] DOB: 1950/06/15 Today's Date: 08/27/2011 Time: 4098-1191 (4782-9562: Pt. was eating when OT came. OT asked questions.) OT Time Calculation (min): 37 min  OT Assessment / Plan / Recommendation Clinical Impression  Pt. is POD #1 lumbar fusion L3-4. Mobility llimited by pain and new back precautions. Will benefit from OT to address these and the below problem list so as to maximize mobility and independence in ADLs for safe d/c home. Back precautions reviewed and pt. verbalized 3/3.    OT Assessment  Patient needs continued OT Services    Follow Up Recommendations  Home health OT    Barriers to Discharge      Equipment Recommendations       Recommendations for Other Services    Frequency  Min 2X/week    Precautions / Restrictions Precautions Precautions: Back Required Braces or Orthoses: Spinal Brace Spinal Brace: Applied in sitting position   Pertinent Vitals/Pain Vitals Monitored and Stable.    ADL  Eating/Feeding: Performed;Independent Where Assessed - Eating/Feeding: Bed level Toilet Transfer: Performed;Modified independent Toilet Transfer Method: Stand pivot Acupuncturist: Regular height toilet;Grab bars Tub/Shower Transfer: Performed;Min guard Web designer Method: Ambulating Equipment Used: Gait belt;Rolling walker ADL Comments:  Pt. was able to state 3/3 back precautions. She performed toilet transfer, tub transfer, and ambulated <100 ft.     OT Diagnosis:    OT Problem List:   OT Treatment Interventions: Self-care/ADL training;Patient/family education   OT Goals Acute Rehab OT Goals OT Goal Formulation: With patient ADL Goals Pt Will Perform Grooming: with modified independence;Supported;Standing at sink ADL Goal: Grooming - Progress: Goal set today Pt Will Perform Upper Body Bathing: Sitting in shower;with modified independence ADL Goal: Upper Body  Bathing - Progress: Goal set today Pt Will Perform Lower Body Bathing: with min assist;Sitting in shower;Supported ADL Goal: Lower Body Bathing - Progress: Goal set today Pt Will Perform Upper Body Dressing: with modified independence;Unsupported;Sitting, bed ADL Goal: Upper Body Dressing - Progress: Goal set today Pt Will Perform Lower Body Dressing: with min assist;Sit to stand from chair;Supported ADL Goal: Lower Body Dressing - Progress: Goal set today Pt Will Transfer to Toilet: with modified independence;Stand pivot transfer;Regular height toilet ADL Goal: Toilet Transfer - Progress: Goal set today Pt Will Perform Toileting - Clothing Manipulation: with modified independence;Standing ADL Goal: Toileting - Clothing Manipulation - Progress: Goal set today Pt Will Perform Toileting - Hygiene: with modified independence;Leaning right and/or left on 3-in-1/toilet ADL Goal: Toileting - Hygiene - Progress: Goal set today Pt Will Perform Tub/Shower Transfer: Tub transfer;Ambulation ADL Goal: Tub/Shower Transfer - Progress: Goal set today  Visit Information  Last OT Received On: 08/27/11 Assistance Needed: +1    Subjective Data  Subjective: "I would like to walk down the hall."   Prior Functioning  Home Living Lives With: Spouse;Daughter Available Help at Discharge: Family;Available 24 hours/day Type of Home: House Home Access: Stairs to enter [employer] of Steps: 1 Entrance Stairs-Rails: Right Home Layout: One level Bathroom Shower/Tub: Forensic scientist: Standard Bathroom Accessibility: Yes How Accessible: Accessible via walker Home Adaptive Equipment: Straight cane Prior Function Level of Independence: Independent Able to Take Stairs?: Yes Driving: Yes Vocation: Full time employment Communication Communication: No difficulties Dominant Hand: Right    Cognition  Overall Cognitive Status: Appears within functional limits for tasks  assessed/performed Arousal/Alertness: Awake/alert Orientation Level: Appears intact for tasks assessed Behavior During Session: [facility] for tasks performed    Extremity/Trunk Assessment  Right Upper Extremity Assessment RUE ROM/Strength/Tone: WFL for tasks assessed RUE Coordination: WFL - gross/fine motor Left Upper Extremity Assessment LUE ROM/Strength/Tone: WFL for tasks assessed LUE Coordination: WFL - gross/fine motor   Mobility Bed Mobility Bed Mobility: Rolling Right;Right Sidelying to Sit;Sitting - Scoot to Edge of Bed Rolling Right: With rail;5: Supervision Right Sidelying to Sit: HOB flat;With rails;5: Supervision Sitting - Scoot to Edge of Bed: 5: Supervision;With rail Transfers Transfers: Sit to Stand;Stand to Sit Sit to Stand: 4: Min guard;With upper extremity assist;From toilet;From bed Stand to Sit: 4: Min guard;With upper extremity assist;To bed;To chair/3-in-1;To toilet;With armrests   Exercise    Balance    End of Session OT - End of Session Activity Tolerance: Patient tolerated treatment well Patient left: in chair;with call bell/phone within reach   Jurgen Groeneveld, Mardella Layman 08/27/2011, 1:30 PM

## 2011-08-27 NOTE — Progress Notes (Signed)
Meagan Lamb 61 y.o. 08/26/2011  L3-4 SPONDYLOSIS WITH RADICULAR LEG PAIN  1 Day Post-Op   Subjective Patient complaints:doing well with some problems : pain at incision sites and left groin  Objective Back:  no straight leg raising pain Neck:no pain with ROM Neuro exam: grossly normal Vascular: peripheral pulses symmetrical Abdomen: abdomen is soft without significant tenderness, masses, organomegaly or guarding Wound: dressing C/D/I and no drainage  Lab. Results: No results found for this basename:  WBC:2,HGB:2,HCT:2,PLT:2 in the last 72 hours BMET No results found for this basename:  NA:2,K:2,CL:2,CO2:2,GLUCOSE:2,BUN:2,CREATININE:2,CALCIUM:2,CBG:2 in the last 72 hours  INR (no units)  Date                       Value       Range   Status  08/20/2011                   0.86    Final ---------- VITALS ---------------------------              08/27/11                     0602        ---------------------------  BP:          138/77        Pulse:         74          Temp:   97.7 F (36.5 C)  Resp:          18         ---------------------------   Chest 2 View  08/20/2011  *RADIOLOGY REPORT*  Clinical Data: Preop, smoker  CHEST - 2 VIEW  Comparison: 04/19/2009  Findings: Cardiomediastinal silhouette is unremarkable.  Mild hyperinflation.  No acute infiltrate or pleural effusion.  No pulmonary edema.  Mild lower thoracic dextroscoliosis.  IMPRESSION: No active disease.  Mild hyperinflation.  Original Report Authenticated By: Natasha Mead, M.D.   Dg Lumbar Spine 2-3 Views  08/26/2011  *RADIOLOGY REPORT*  Clinical Data: L3-4 fusion.  LUMBAR SPINE - 2-3 VIEW  Comparison: Lumbar spine radiographs dated 08/20/2011.  Findings: Two PA and two lateral C-arm views of the lumbar spine demonstrate interval interbody bone plug and left pedicle screw and rod fixation at the L3-4 level with normal alignment.  Overlying hernia repair mesh anchors are again demonstrated.   IMPRESSION: Interval fusion at the L3-4 level with normal alignment.  Original Report Authenticated By: Darrol Angel, M.D.   Dg Lumbar Spine 2-3 Views  08/20/2011  *RADIOLOGY REPORT*  Clinical Data: Preop  LUMBAR SPINE - 2-3 VIEW  Comparison: Sagittal images CT scan 07/09/2010  Findings: Two views of the lumbar spine submitted.  Post hernia repair changes noted abdominal wall.  Post cholecystectomy surgical clips are noted.  No acute fracture or subluxation.  Mild levoscoliosis.  Again noted disc space flattening with mild anterior spurring at T12-L1 level. Multilevel facet degenerative changes again noted.  IMPRESSION: No acute fracture or subluxation.  Mild levoscoliosis. Degenerative changes T12-L1 level.  Original Report Authenticated By: Natasha Mead, M.D.     Assessment/ Plan Patient: Doing well postoperatively. Plan: Encourage ambulation & incentive spirometer Xrays: satisfactory   Disposition:  Plan on D/C to home Saturday as long as she is cleared by PT and either having BM or flatus. Discharge condition: Good Discharge destination: Home Plan for follow-up: 1: in 2 weeks    2: instructions provided (pre-printed)  3: scripts in chart    Chadwick Reiswig D 5/31/20137:48 AM

## 2011-08-27 NOTE — Progress Notes (Signed)
  Johnston, Hridhaan Yohn Brynn   OTR/L Pager: [phone] Office: [phone] .  

## 2011-08-28 NOTE — Progress Notes (Signed)
Physical Therapy Treatment Patient Details Name: SOLENE HEREFORD MRN: [id number] DOB: [birth year] Today's Date: 08/28/2011 Time: 4098-1191 PT Time Calculation (min): 16 min  PT Assessment / Plan / Recommendation Comments on Treatment Session  patient progressing very well. plans on DCing today.     Follow Up Recommendations  No PT follow up    Barriers to Discharge        Equipment Recommendations  Rolling walker with 5" wheels    Recommendations for Other Services    Frequency Min 5X/week   Plan Discharge plan remains appropriate    Precautions / Restrictions Precautions Precautions: Back Precaution Comments: Patient able to recall all back precautions and maintain with mobility Spinal Brace: Lumbar corset;Applied in sitting position   Pertinent Vitals/Pain 8/10 back precautions    Mobility  Bed Mobility Bed Mobility: Sit to Sidelying Left Rolling Right: 6: Modified independent (Device/Increase time) Right Sidelying to Sit: 6: Modified independent (Device/Increase time) Sitting - Scoot to Edge of Bed: 6: Modified independent (Device/Increase time) Sit to Sidelying Left: 6: Modified independent (Device/Increase time) Transfers Sit to Stand: 6: Modified independent (Device/Increase time) Stand to Sit: 6: Modified independent (Device/Increase time) Ambulation/Gait Ambulation/Gait Assistance: 6: Modified independent (Device/Increase time) Ambulation Distance (Feet): 230 Feet Assistive device: Rolling walker Ambulation/Gait Assistance Details: Patient starting to ambulate with step through gait Gait Pattern: Step-through pattern;Step-to pattern Stairs: Yes Stairs Assistance: 4: Min guard Stair Management Technique: Backwards;With walker Number of Stairs: 1  (practiced twice)    Exercises     PT Diagnosis:    PT Problem List:   PT Treatment Interventions:     PT Goals Acute Rehab PT Goals PT Goal: Rolling Supine to Right Side - Progress: Met PT Goal: Rolling  Supine to Left Side - Progress: Met PT Goal: Supine/Side to Sit - Progress: Met PT Goal: Sit to Supine/Side - Progress: Met PT Goal: Sit to Stand - Progress: Met PT Goal: Stand to Sit - Progress: Met PT Transfer Goal: Bed to Chair/Chair to Bed - Progress: Met PT Goal: Ambulate - Progress: Met PT Goal: Up/Down Stairs - Progress: Met Additional Goals PT Goal: Additional Goal #1 - Progress: Met  Visit Information  Last PT Received On: 08/28/11 Assistance Needed: +1    Subjective Data      Cognition  Overall Cognitive Status: Appears within functional limits for tasks assessed/performed Arousal/Alertness: Awake/alert Orientation Level: Appears intact for tasks assessed Behavior During Session: [facility] for tasks performed    Balance     End of Session PT - End of Session Equipment Utilized During Treatment: Gait belt;Back brace Activity Tolerance: Patient tolerated treatment well Patient left: in chair Nurse Communication: Mobility status    Fredrich Birks 08/28/2011, 10:48 AM 08/28/2011 Fredrich Birks PTA [phone] pager [phone] office

## 2011-08-28 NOTE — Progress Notes (Signed)
Subjective: 2 Days Post-Op Procedure(s) (LRB): ANTERIOR LATERAL LUMBAR FUSION 1 LEVEL (N/A) Patient reports pain as mild.   Denies CP or SOB.  Voiding without difficulty. Positive flatus. Objective: Vital signs in last 24 hours: Temp:  [98.2 F (36.8 C)-98.6 F (37 C)] 98.3 F (36.8 C) (06/01 0436) Pulse Rate:  [73-88] 78  (06/01 0436) Resp:  [18] 18  (06/01 0436) BP: (119-126)/(66-88) 119/79 mmHg (06/01 0436) SpO2:  [92 %-98 %] 95 % (06/01 0436)  Intake/Output from previous day: 05/31 0701 - 06/01 0700 In: 480 [P.O.:480] Out: -  Intake/Output this shift:    No results found for this basename: HGB:5 in the last 72 hours No results found for this basename: WBC:2,RBC:2,HCT:2,PLT:2 in the last 72 hours No results found for this basename: NA:2,K:2,CL:2,CO2:2,BUN:2,CREATININE:2,GLUCOSE:2,CALCIUM:2 in the last 72 hours No results found for this basename: LABPT:2,INR:2 in the last 72 hours  Neurologically intact Neurovascular intact Sensation intact distally Incision: no drainage Compartment soft  Assessment/Plan: 2 Days Post-Op Procedure(s) (LRB): ANTERIOR LATERAL LUMBAR FUSION 1 LEVEL (N/A) Discharge home with home health  Sherrill Mckamie R. 08/28/2011, 8:14 AM

## 2011-08-28 NOTE — Progress Notes (Signed)
   CARE MANAGEMENT NOTE 08/28/2011  Patient:  Meagan Lamb, Meagan Lamb   Account Number:  [id number]  Date Initiated:  08/27/2011  Documentation initiated by:  Vance Peper  Subjective/Objective Assessment:   61 yr old female s/p L3-4 anterior discectomy and fusion.     Action/Plan:   Spoke with patient regarding home health needs and DME. Choice offered.Entered in TLC. Spoke with [facility] for rolling walker.   Anticipated DC Date:  08/28/2011   Anticipated DC Plan:  HOME W HOME HEALTH SERVICES      DC Planning Services  CM consult      PAC Choice  DURABLE MEDICAL EQUIPMENT  HOME HEALTH   Choice offered to / List presented to:  C-1 Patient   DME arranged  Levan Hurst      DME agency  [facility]     HH arranged  HH-3 OT  HH-2 PT      [facility] agency  [facility]   Status of service:  Completed, signed off Medicare Important Message given?   (If response is "NO", the following Medicare IM given date fields will be blank) Date Medicare IM given:   Date Additional Medicare IM given:    Discharge Disposition:  HOME W HOME HEALTH SERVICES  Per UR Regulation:    If discussed at Long Length of Stay Meetings, dates discussed:    Comments:  08/28/2011 1400 Faxed order, facesheet, and H&P to [place]. Rep aware of pt's scheduled d/c today. Isidoro Donning RN CCM Case Mgmt phone [phone]

## 2011-08-31 ENCOUNTER — Encounter (HOSPITAL_COMMUNITY): Payer: Self-pay | Admitting: Orthopedic Surgery

## 2013-04-02 IMAGING — CR DG LUMBAR SPINE 2-3V
2 series · 2 of 2 positions shown · non-contrast
Comparison: Sagittal images CT scan 07/09/2010

CLINICAL DATA: Preop

LUMBAR SPINE - 2-3 VIEW

[view not recorded (1 of 2)]
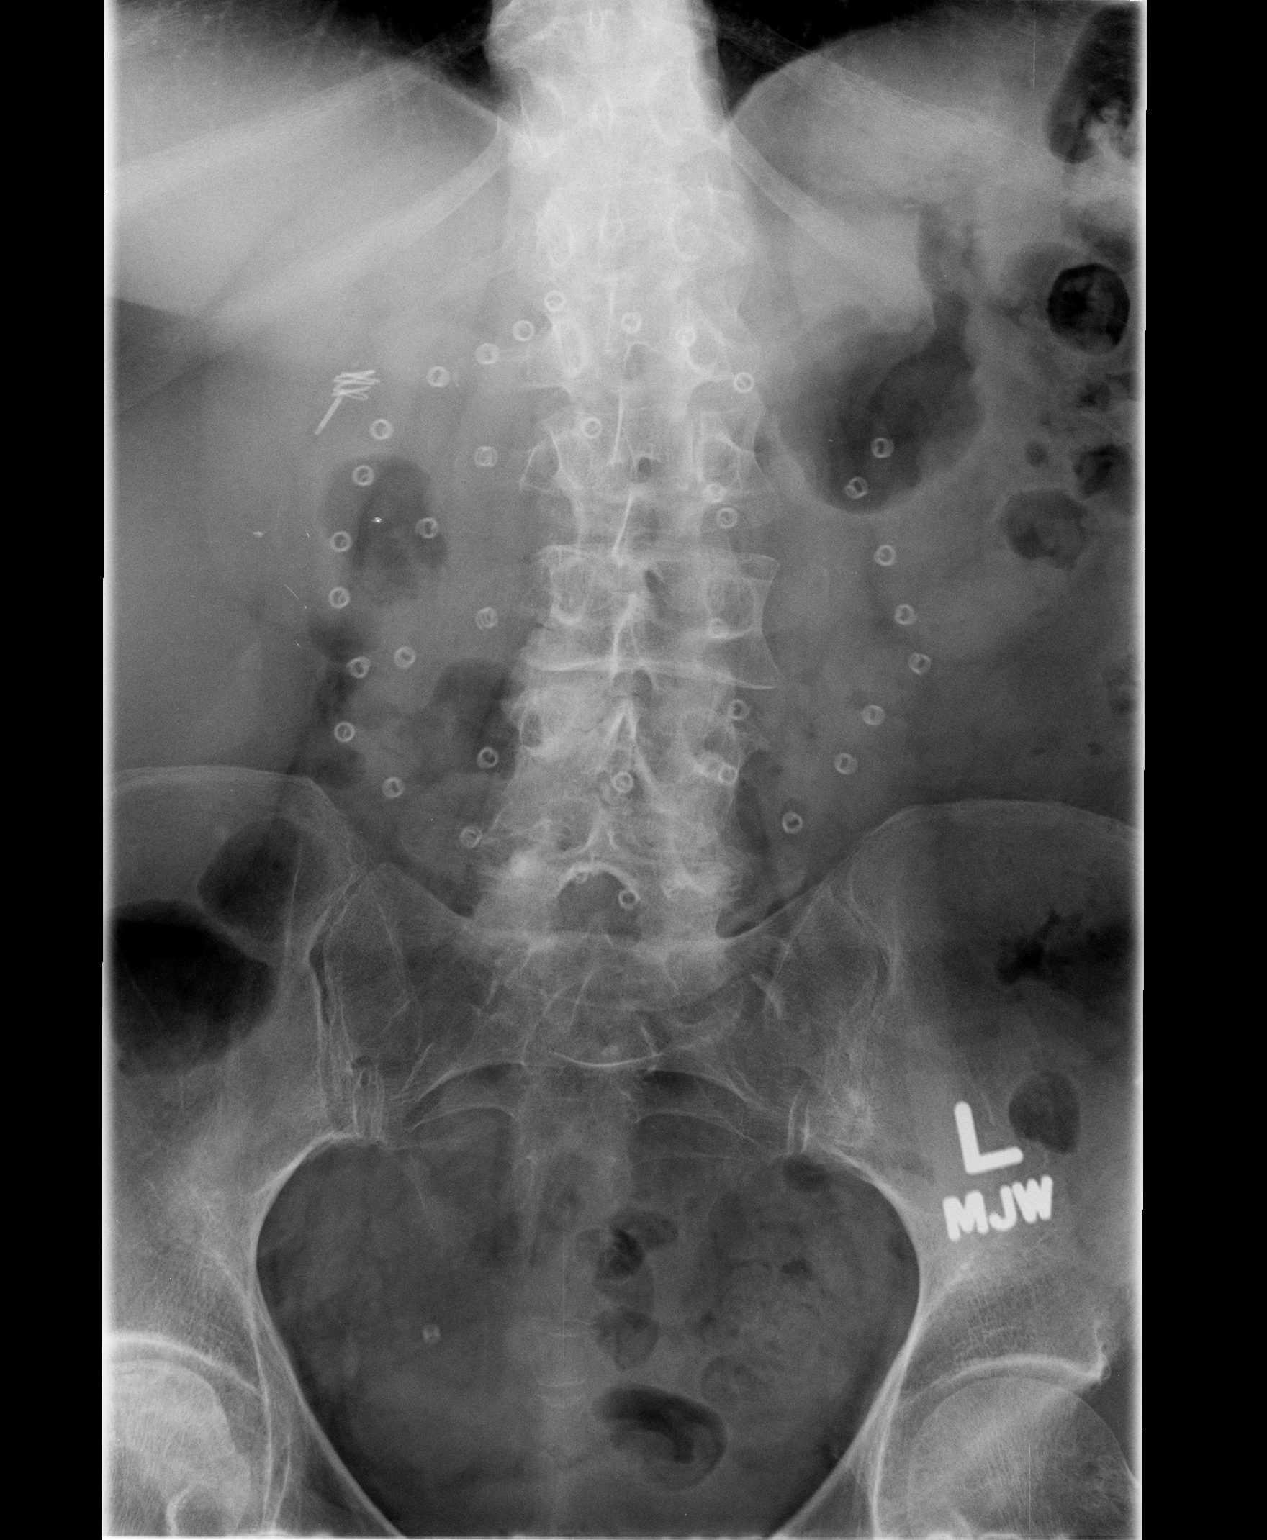

[view not recorded (2 of 2)]
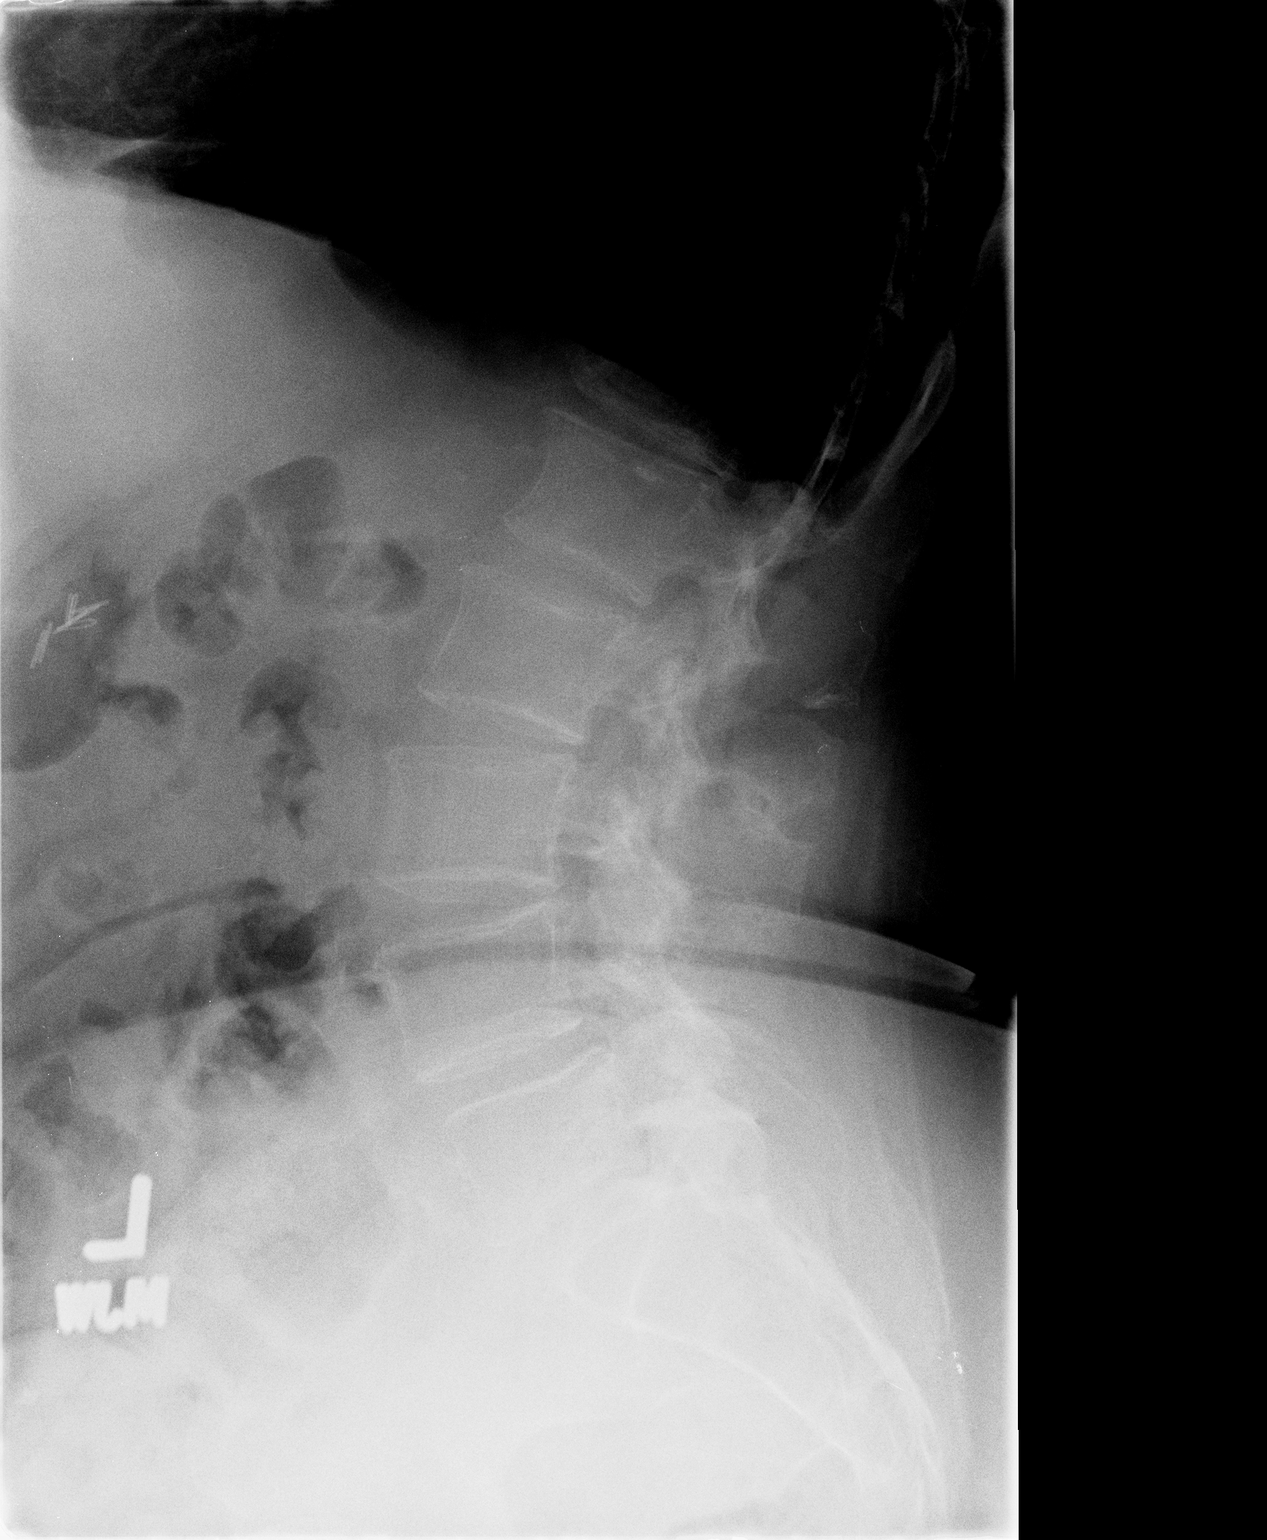

[2 of 2 positions shown; findings below may reference images not displayed]

FINDINGS: Two views of the lumbar spine submitted.  Post hernia
repair changes noted abdominal wall.  Post cholecystectomy surgical
clips are noted.  No acute fracture or subluxation.  Mild
levoscoliosis.  Again noted disc space flattening with mild
anterior spurring at T12-L1 level. Multilevel facet degenerative
changes again noted.
IMPRESSION: No acute fracture or subluxation.  Mild levoscoliosis.
Degenerative changes T12-L1 level.

## 2013-04-08 IMAGING — RF DG C-ARM 61-120 MIN
1 series · 4 of 4 positions shown · non-contrast
Comparison: Lumbar spine radiographs dated 08/20/2011.

CLINICAL DATA: L3-4 fusion.

LUMBAR SPINE - 2-3 VIEW

[Series 1: run · 4 of 4 slices shown]
[im 1/4]
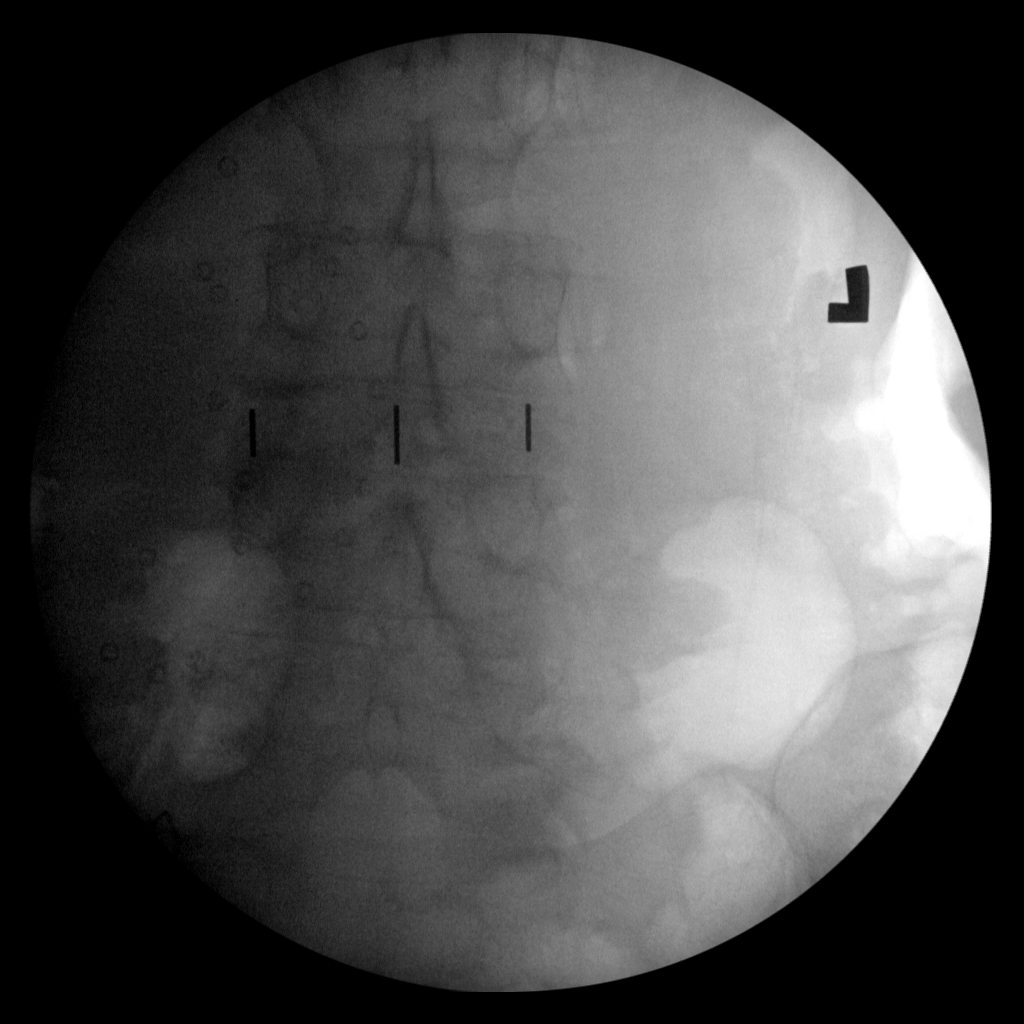
[im 2/4]
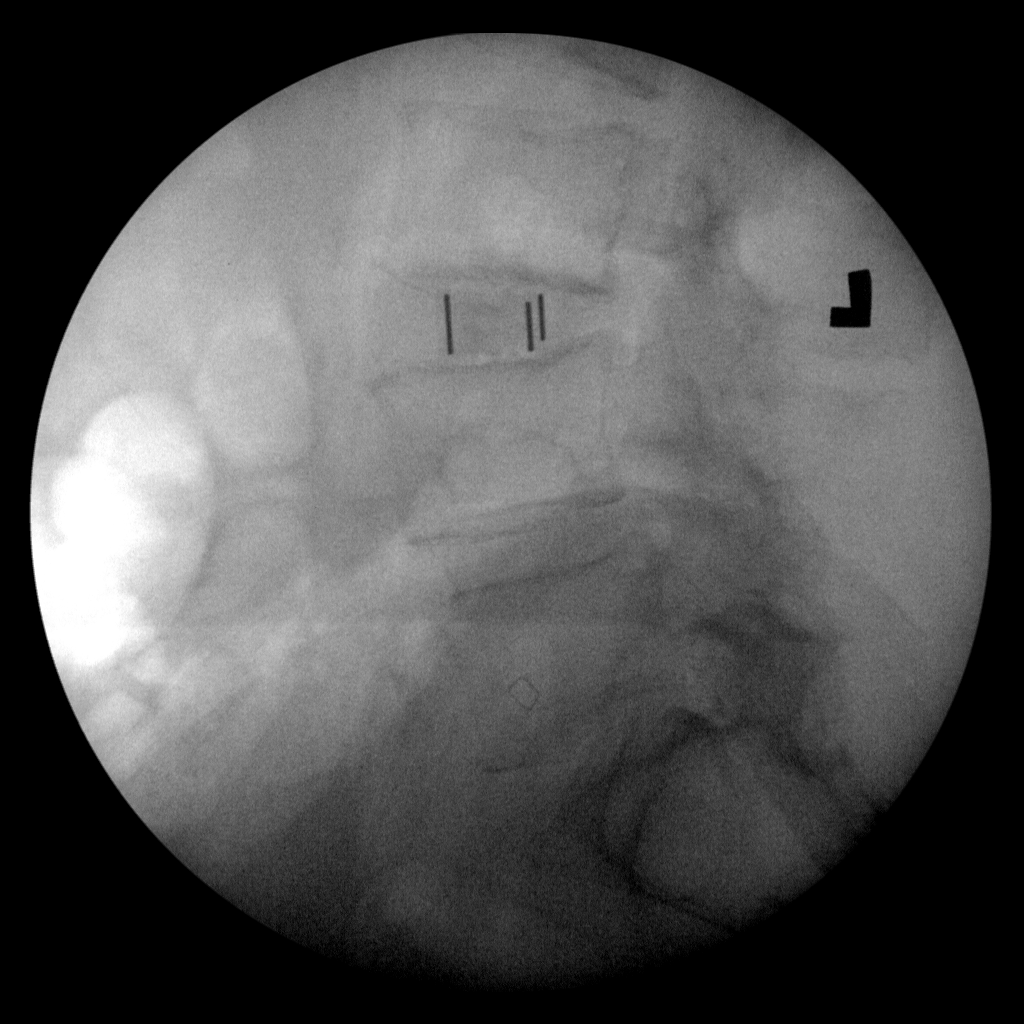
[im 3/4]
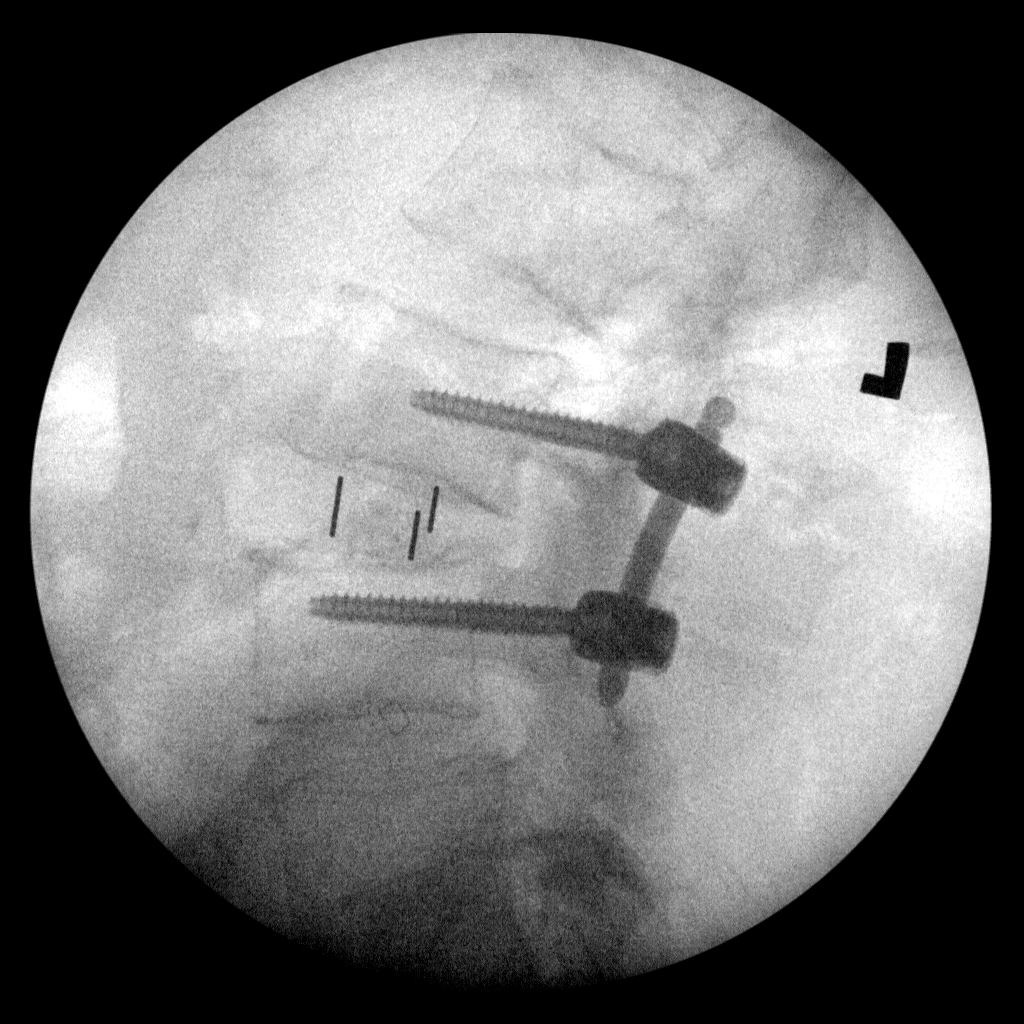
[im 4/4]
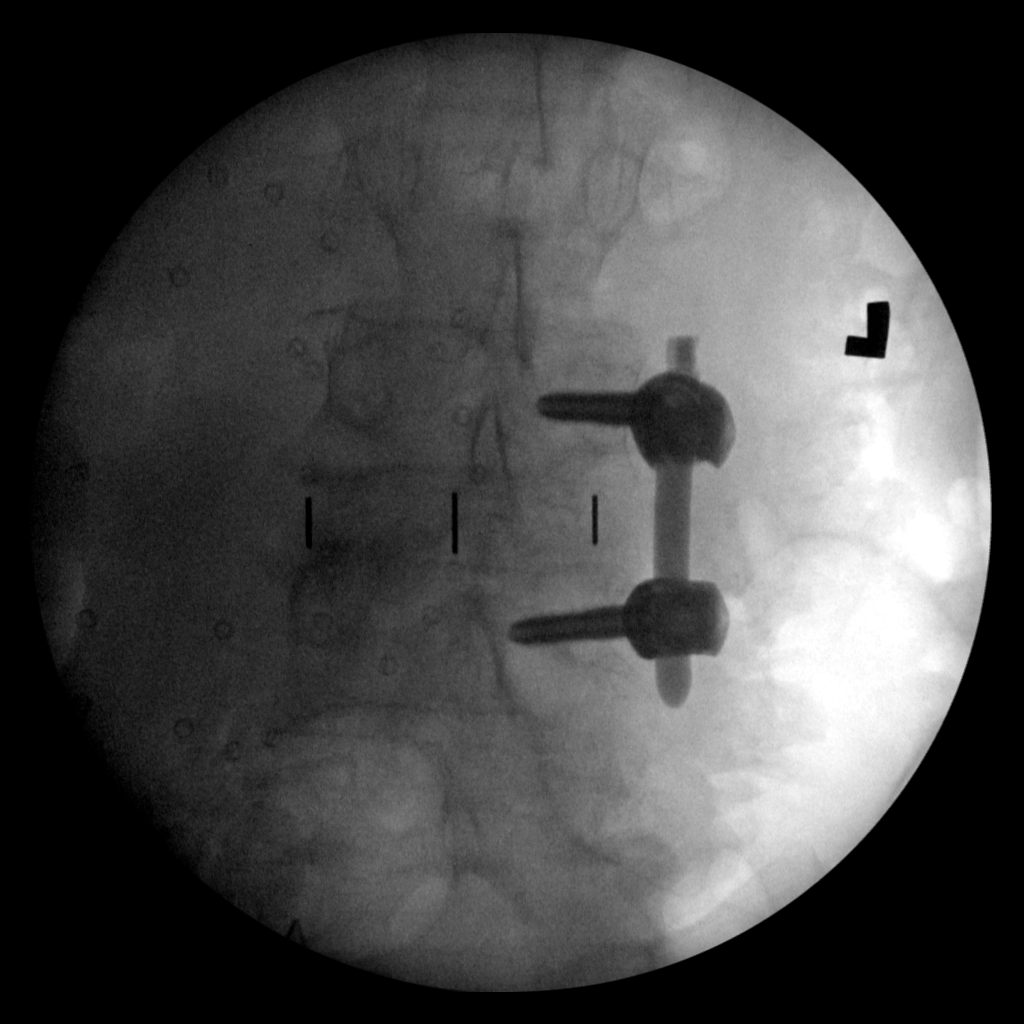

[4 of 4 positions shown; findings below may reference images not displayed]

FINDINGS: Two PA and two lateral C-arm views of the lumbar spine
demonstrate interval interbody bone plug and left pedicle screw and
rod fixation at the L3-4 level with normal alignment.  Overlying
hernia repair mesh anchors are again demonstrated.
IMPRESSION: Interval fusion at the L3-4 level with normal alignment.

## 2014-02-25 DIAGNOSIS — J449 Chronic obstructive pulmonary disease, unspecified: Secondary | ICD-10-CM | POA: Diagnosis not present

## 2014-02-25 DIAGNOSIS — Z720 Tobacco use: Secondary | ICD-10-CM | POA: Diagnosis not present

## 2014-02-25 DIAGNOSIS — Z23 Encounter for immunization: Secondary | ICD-10-CM | POA: Diagnosis not present

## 2014-10-24 DIAGNOSIS — F17200 Nicotine dependence, unspecified, uncomplicated: Secondary | ICD-10-CM | POA: Diagnosis not present

## 2014-10-24 DIAGNOSIS — G4700 Insomnia, unspecified: Secondary | ICD-10-CM | POA: Diagnosis not present

## 2015-04-02 DIAGNOSIS — M25551 Pain in right hip: Secondary | ICD-10-CM | POA: Diagnosis not present

## 2015-04-04 DIAGNOSIS — M87051 Idiopathic aseptic necrosis of right femur: Secondary | ICD-10-CM | POA: Diagnosis not present

## 2015-04-11 DIAGNOSIS — M25551 Pain in right hip: Secondary | ICD-10-CM | POA: Diagnosis not present

## 2015-04-11 DIAGNOSIS — M87051 Idiopathic aseptic necrosis of right femur: Secondary | ICD-10-CM | POA: Diagnosis not present

## 2015-05-07 DIAGNOSIS — J189 Pneumonia, unspecified organism: Secondary | ICD-10-CM | POA: Diagnosis not present

## 2015-05-16 DIAGNOSIS — M87051 Idiopathic aseptic necrosis of right femur: Secondary | ICD-10-CM | POA: Diagnosis not present

## 2015-06-12 DIAGNOSIS — R079 Chest pain, unspecified: Secondary | ICD-10-CM | POA: Diagnosis not present

## 2015-10-27 DIAGNOSIS — R05 Cough: Secondary | ICD-10-CM | POA: Diagnosis not present

## 2015-10-27 DIAGNOSIS — M654 Radial styloid tenosynovitis [de Quervain]: Secondary | ICD-10-CM | POA: Diagnosis not present

## 2015-11-18 DIAGNOSIS — M87051 Idiopathic aseptic necrosis of right femur: Secondary | ICD-10-CM | POA: Diagnosis not present

## 2015-11-20 DIAGNOSIS — I700 Atherosclerosis of aorta: Secondary | ICD-10-CM | POA: Diagnosis not present

## 2015-11-20 DIAGNOSIS — M1990 Unspecified osteoarthritis, unspecified site: Secondary | ICD-10-CM | POA: Diagnosis not present

## 2015-11-20 DIAGNOSIS — R5383 Other fatigue: Secondary | ICD-10-CM | POA: Diagnosis not present

## 2015-11-20 DIAGNOSIS — J449 Chronic obstructive pulmonary disease, unspecified: Secondary | ICD-10-CM | POA: Diagnosis not present

## 2015-11-20 DIAGNOSIS — R52 Pain, unspecified: Secondary | ICD-10-CM | POA: Diagnosis not present

## 2015-11-20 DIAGNOSIS — Z79899 Other long term (current) drug therapy: Secondary | ICD-10-CM | POA: Diagnosis not present

## 2015-11-20 DIAGNOSIS — Z01818 Encounter for other preprocedural examination: Secondary | ICD-10-CM | POA: Diagnosis not present

## 2015-11-20 DIAGNOSIS — E785 Hyperlipidemia, unspecified: Secondary | ICD-10-CM | POA: Diagnosis not present

## 2015-11-20 DIAGNOSIS — E7800 Pure hypercholesterolemia, unspecified: Secondary | ICD-10-CM | POA: Diagnosis not present

## 2015-11-20 DIAGNOSIS — R05 Cough: Secondary | ICD-10-CM | POA: Diagnosis not present

## 2015-11-20 DIAGNOSIS — M79609 Pain in unspecified limb: Secondary | ICD-10-CM | POA: Diagnosis not present

## 2015-11-20 DIAGNOSIS — Z01810 Encounter for preprocedural cardiovascular examination: Secondary | ICD-10-CM | POA: Diagnosis not present

## 2015-12-10 DIAGNOSIS — I824Z1 Acute embolism and thrombosis of unspecified deep veins of right distal lower extremity: Secondary | ICD-10-CM | POA: Diagnosis not present

## 2015-12-10 DIAGNOSIS — M79604 Pain in right leg: Secondary | ICD-10-CM | POA: Diagnosis not present

## 2015-12-10 DIAGNOSIS — M79605 Pain in left leg: Secondary | ICD-10-CM | POA: Diagnosis not present

## 2016-01-08 DIAGNOSIS — G4700 Insomnia, unspecified: Secondary | ICD-10-CM | POA: Diagnosis not present

## 2016-01-08 DIAGNOSIS — J441 Chronic obstructive pulmonary disease with (acute) exacerbation: Secondary | ICD-10-CM | POA: Diagnosis not present

## 2016-01-08 DIAGNOSIS — M25532 Pain in left wrist: Secondary | ICD-10-CM | POA: Diagnosis not present

## 2016-01-08 DIAGNOSIS — J302 Other seasonal allergic rhinitis: Secondary | ICD-10-CM | POA: Diagnosis not present

## 2016-01-19 DIAGNOSIS — M87051 Idiopathic aseptic necrosis of right femur: Secondary | ICD-10-CM | POA: Diagnosis not present

## 2016-02-04 DIAGNOSIS — Z23 Encounter for immunization: Secondary | ICD-10-CM | POA: Diagnosis not present

## 2016-02-04 DIAGNOSIS — M1611 Unilateral primary osteoarthritis, right hip: Secondary | ICD-10-CM | POA: Diagnosis present

## 2016-02-04 DIAGNOSIS — J449 Chronic obstructive pulmonary disease, unspecified: Secondary | ICD-10-CM | POA: Diagnosis present

## 2016-02-04 DIAGNOSIS — M879 Osteonecrosis, unspecified: Secondary | ICD-10-CM | POA: Diagnosis present

## 2016-02-04 DIAGNOSIS — F17210 Nicotine dependence, cigarettes, uncomplicated: Secondary | ICD-10-CM | POA: Diagnosis not present

## 2016-02-04 DIAGNOSIS — R269 Unspecified abnormalities of gait and mobility: Secondary | ICD-10-CM | POA: Diagnosis present

## 2016-02-04 DIAGNOSIS — K219 Gastro-esophageal reflux disease without esophagitis: Secondary | ICD-10-CM | POA: Diagnosis present

## 2016-02-04 DIAGNOSIS — M87051 Idiopathic aseptic necrosis of right femur: Secondary | ICD-10-CM | POA: Diagnosis not present

## 2016-02-04 DIAGNOSIS — Z96641 Presence of right artificial hip joint: Secondary | ICD-10-CM | POA: Diagnosis not present

## 2016-02-04 DIAGNOSIS — M87851 Other osteonecrosis, right femur: Secondary | ICD-10-CM | POA: Diagnosis not present

## 2016-02-04 DIAGNOSIS — Z86718 Personal history of other venous thrombosis and embolism: Secondary | ICD-10-CM | POA: Diagnosis not present

## 2016-02-04 DIAGNOSIS — Z471 Aftercare following joint replacement surgery: Secondary | ICD-10-CM | POA: Diagnosis not present

## 2016-02-04 DIAGNOSIS — Z79899 Other long term (current) drug therapy: Secondary | ICD-10-CM | POA: Diagnosis not present

## 2016-02-04 DIAGNOSIS — F17200 Nicotine dependence, unspecified, uncomplicated: Secondary | ICD-10-CM | POA: Diagnosis present

## 2016-03-16 DIAGNOSIS — Z96641 Presence of right artificial hip joint: Secondary | ICD-10-CM | POA: Diagnosis not present

## 2016-04-19 DIAGNOSIS — Z96641 Presence of right artificial hip joint: Secondary | ICD-10-CM | POA: Diagnosis not present

## 2016-04-19 DIAGNOSIS — M87051 Idiopathic aseptic necrosis of right femur: Secondary | ICD-10-CM | POA: Diagnosis not present

## 2016-04-22 DIAGNOSIS — M25551 Pain in right hip: Secondary | ICD-10-CM | POA: Diagnosis not present

## 2016-04-30 DIAGNOSIS — M25551 Pain in right hip: Secondary | ICD-10-CM | POA: Diagnosis not present

## 2016-07-20 DIAGNOSIS — Z96641 Presence of right artificial hip joint: Secondary | ICD-10-CM | POA: Diagnosis not present

## 2016-09-18 ENCOUNTER — Encounter: Payer: Self-pay | Admitting: Cardiology

## 2016-09-27 DIAGNOSIS — R0602 Shortness of breath: Secondary | ICD-10-CM | POA: Diagnosis not present

## 2016-09-27 DIAGNOSIS — I509 Heart failure, unspecified: Secondary | ICD-10-CM | POA: Diagnosis not present

## 2016-09-27 DIAGNOSIS — J441 Chronic obstructive pulmonary disease with (acute) exacerbation: Secondary | ICD-10-CM | POA: Diagnosis not present

## 2016-09-27 DIAGNOSIS — E871 Hypo-osmolality and hyponatremia: Secondary | ICD-10-CM | POA: Diagnosis not present

## 2016-09-28 DIAGNOSIS — J441 Chronic obstructive pulmonary disease with (acute) exacerbation: Secondary | ICD-10-CM | POA: Diagnosis not present

## 2016-09-29 DIAGNOSIS — J441 Chronic obstructive pulmonary disease with (acute) exacerbation: Secondary | ICD-10-CM | POA: Diagnosis not present

## 2016-09-30 DIAGNOSIS — N183 Chronic kidney disease, stage 3 (moderate): Secondary | ICD-10-CM | POA: Diagnosis present

## 2016-09-30 DIAGNOSIS — R7881 Bacteremia: Secondary | ICD-10-CM | POA: Diagnosis present

## 2016-09-30 DIAGNOSIS — N179 Acute kidney failure, unspecified: Secondary | ICD-10-CM | POA: Diagnosis present

## 2016-09-30 DIAGNOSIS — E785 Hyperlipidemia, unspecified: Secondary | ICD-10-CM | POA: Diagnosis present

## 2016-09-30 DIAGNOSIS — E871 Hypo-osmolality and hyponatremia: Secondary | ICD-10-CM | POA: Diagnosis present

## 2016-09-30 DIAGNOSIS — B9620 Unspecified Escherichia coli [E. coli] as the cause of diseases classified elsewhere: Secondary | ICD-10-CM | POA: Diagnosis not present

## 2016-09-30 DIAGNOSIS — J441 Chronic obstructive pulmonary disease with (acute) exacerbation: Secondary | ICD-10-CM | POA: Diagnosis not present

## 2016-09-30 DIAGNOSIS — Z716 Tobacco abuse counseling: Secondary | ICD-10-CM | POA: Diagnosis not present

## 2016-09-30 DIAGNOSIS — Z79899 Other long term (current) drug therapy: Secondary | ICD-10-CM | POA: Diagnosis not present

## 2016-09-30 DIAGNOSIS — Z8719 Personal history of other diseases of the digestive system: Secondary | ICD-10-CM | POA: Diagnosis not present

## 2016-09-30 DIAGNOSIS — N390 Urinary tract infection, site not specified: Secondary | ICD-10-CM | POA: Diagnosis present

## 2016-10-05 DIAGNOSIS — K259 Gastric ulcer, unspecified as acute or chronic, without hemorrhage or perforation: Secondary | ICD-10-CM

## 2016-10-05 HISTORY — DX: Gastric ulcer, unspecified as acute or chronic, without hemorrhage or perforation: K25.9

## 2016-10-06 ENCOUNTER — Encounter: Payer: Self-pay | Admitting: Cardiology

## 2016-10-06 DIAGNOSIS — I5032 Chronic diastolic (congestive) heart failure: Secondary | ICD-10-CM | POA: Insufficient documentation

## 2016-10-07 ENCOUNTER — Ambulatory Visit (INDEPENDENT_AMBULATORY_CARE_PROVIDER_SITE_OTHER): Payer: Commercial Managed Care - PPO | Admitting: Cardiology

## 2016-10-07 ENCOUNTER — Encounter: Payer: Self-pay | Admitting: Cardiology

## 2016-10-07 VITALS — BP 102/74 | HR 66 | Ht 67.0 in | Wt 200.1 lb

## 2016-10-07 DIAGNOSIS — E785 Hyperlipidemia, unspecified: Secondary | ICD-10-CM

## 2016-10-07 DIAGNOSIS — R0602 Shortness of breath: Secondary | ICD-10-CM | POA: Diagnosis not present

## 2016-10-07 DIAGNOSIS — R7989 Other specified abnormal findings of blood chemistry: Secondary | ICD-10-CM | POA: Diagnosis not present

## 2016-10-07 NOTE — Patient Instructions (Addendum)
Medication Instructions:  Your physician recommends that you continue on your current medications as directed. Please refer to the Current Medication list given to you today.   Labwork: Your physician recommends that you return for lab work in: today. BMP, BNP, D-dimer   Testing/Procedures: Your physician has requested that you have a lexiscan myoview. For further information please visit [email or web address]. Please follow instruction sheet, as given.    Follow-Up: Your physician recommends that you schedule a follow-up appointment in: 4 weeks.   Any Other Special Instructions Will Be Listed Below (If Applicable).     If you need a refill on your cardiac medications before your next appointment, please call your pharmacy.    [facility] [phone] ask for cardiac rehab for Quit Smart  Go online to ALA or AHA for smoking cessation and coach

## 2016-10-07 NOTE — Progress Notes (Signed)
Cardiology Office Note:    Date:  10/07/2016   ID:  Meagan Lamb, DOB 1950-06-16, MRN [id number]  PCP:  Noni Saupeedding, John F. II, MD  Cardiologist:  Norman HerrlichBrian Delorean Knutzen, MD   Referring MD: Self referred  ASSESSMENT:    1. SOB (shortness of breath)   2. Hyperlipidemia, unspecified hyperlipidemia type    PLAN:    In order of problems listed above:  1. Her shortness of breath is ongoing appears multifactorial with underlying chronic lung disease but also an element of edema and orthopnea and elevated BNP level. Now that her infectious illness is resolved and renal function improved I asked her to recheck the BNP level have a d-dimer to screen for venous thromboembolism with a history remote DVT varicosities and edema and a myocardial perfusion study to assess for ischemia and evaluate left and right ventricular function. If her BNP level remains elevated he'll place her on a loop diuretic, and d-dimer is elevated shunt undergo further evaluation for venous thromboembolism embolism with duplex of the lower extremities and lung scan. If she has high risk markers and a myocardial perfusion imaging she benefit from further evaluation with coronary angiography. 2. Stable continue her statin  Next appointment in 3-4 weeks   Medication Adjustments/Labs and Tests Ordered: Current medicines are reviewed at length with the patient today.  Concerns regarding medicines are outlined above.  Orders Placed This Encounter  Procedures  . NM Myocar Single W/Spect W/Wall Motion And EF  . B Nat Peptide  . Basic Metabolic Panel (BMET)  . D-Dimer, Quantitative   No orders of the defined types were placed in this encounter.    Chief Complaint  Patient presents with  . New Patient (Initial Visit)    NP per self referral to evaulate CHF.  [facility]  . Dizziness  . Shortness of Breath    History of Present Illness:    Meagan Lamb is a 66 y.o. female with COPD who is being seen today for  the evaluation of A diagnosis of heart failure [facility] 09/27/2016 at the request of Arvin Collardedding, John F. II, MD. She presented with weakness fever increasing shortness of breath her BNP level was greater than 11 pounds in but she did not have physical findings of heart failure chest x-ray showed COPD and her EKG in the emergency room showed sinus rhythm and frequent atrial premature beats and a COPD pattern. Her white count was markedly elevated and she had urine and blood cultures showing gram-negative rod Escherichia coli. She had acute renal insufficiency creatinine 1.7 in the ED that normalized and her discharge diagnosis did not include heart failure. She has no history of underlying structural heart disease putting her at risk for heart failure.She has a history of lower extremity venous disease with remote stripping but also relates that she had what sounds like superficial thrombophlebitis 15 years ago but did not receive long-term anticoagulation.   Past Medical History:  Diagnosis Date  . Anxiety   . Arthritis   . Asthma   . Blood transfusion    at 686 months old  . Complication of anesthesia    "takes her a long time to come out of it"  . COPD (chronic obstructive pulmonary disease) (HCC)   . Depression   . GERD (gastroesophageal reflux disease)   . H/O hiatal hernia   . Headache(784.0)    reoccuring migranes  . Mood disorder (HCC)   .  Ovarian failure   . Peripheral vascular disease (HCC)    vein stripping due to poor circulation  . Recurrent upper respiratory infection (URI)    bronchitis March or April  . Shortness of breath    at times per patient  . Smoker 08/09/2011   Followed in Pulmonary clinic/ [facility]/ [place]    . Stomach ulcer 10/05/2016  . Tobacco abuse     Past Surgical History:  Procedure Laterality Date  . ANTERIOR LAT LUMBAR FUSION  08/26/2011   Procedure: ANTERIOR LATERAL LUMBAR FUSION 1 LEVEL;  Surgeon: Venita Lick, MD;  Location: [facility] OR;  Service: Orthopedics;  Laterality: N/A;  XLIF WITH POSTERIOR SPINAL FUSION L3-4  . APPENDECTOMY    . CHOLECYSTECTOMY    . GALLBLADDER SURGERY  2009  . HERNIA REPAIR     umbilical  . LUMBAR DISC SURGERY    . LUMBAR FUSION  08/26/2011   L3  L4  . TUBAL LIGATION  1986  . VARICOSE VEIN SURGERY      Current Medications: Current Meds  Medication Sig  . ANORO ELLIPTA 62.5-25 MCG/INH AEPB Inhale 1 puff into the lungs daily.  Marland Kitchen atorvastatin (LIPITOR) 20 MG tablet Take 1 tablet by mouth daily.  . cefUROXime (CEFTIN) 500 MG tablet Take 1 tablet by mouth 2 (two) times daily.  . fluticasone (FLONASE) 50 MCG/ACT nasal spray Place 2 sprays into both nostrils Daily.  Marland Kitchen ipratropium-albuterol (DUONEB) 0.5-2.5 (3) MG/3ML SOLN Take 3 mLs by nebulization 3 (three) times daily.  . Probiotic Product (PROBIOTIC PO) Take 1 capsule by mouth daily.  . QUEtiapine (SEROQUEL) 50 MG tablet Take 1 tablet by mouth at bedtime as needed.     Allergies:   Codeine; Adhesive [tape]; Aspirin; Bee venom; Latex; Morphine and related; Penicillins; and Sulfur   Social History   Social History  . Marital status: Married    Spouse name: N/A  . Number of children: N/A  . Years of education: N/A   Social History Main Topics  . Smoking status: Current Every Day Smoker    Packs/day: 1.00    Years: 35.00    Types: Cigarettes  . Smokeless tobacco: Never Used  . Alcohol use No  . Drug use: No  . Sexual activity: Yes   Other Topics Concern  . None   Social History Narrative  . None     Family History: The patient's family history includes Cancer in her mother; Diabetes in her mother; Heart disease in her father; Hypertension in her mother.  ROS:   ROS Please see the history of present illness.    Review of systems initial and signed. All other systems reviewed and are negative.  EKGs/Labs/Other Studies Reviewed:    The following studies were reviewed today: [facility] ED  admission discharge records including chest x-ray findings of COPD EKG independently reviewed showing sinus tachycardia APCs and COPD pattern and review of her laboratory studies troponin normal BNP level markedly elevated greater than 11,000 elevated white count renal insufficiency and admission creatinine 1.7 and blood and urine cultures showing gram-negative rod Escherichia coli.  EKG:  EKG is ordered today.  The ekg ordered today demonstrates subtle pattern of lung disease, SRTH OW normal and unchanged 09/27/16  Oxygen saturation normal at 96%  Physical Exam:    VS:  BP 102/74   Pulse 66   Ht 5\' 7"  (1.702 m)   Wt 200 lb 1.9 oz (90.8 kg)   SpO2 96%   BMI 31.34 kg/m  Wt Readings from Last 3 Encounters:  10/07/16 200 lb 1.9 oz (90.8 kg)  08/20/11 219 lb 1.6 oz (99.4 kg)  08/09/11 217 lb 12.8 oz (98.8 kg)     GEN:  Well nourished, well developed in no acute distress COPD habitus HEENT: Normal NECK: No JVD; No carotid bruits LYMPHATICS: No lymphadenopathy CARDIAC: RRR, no murmurs, rubs, gallops distant HS RESPIRATORY:  Hyperinflated, diffused decreased BS, prolonged expiration and faint diffuse wheezing ABDOMEN: Soft, non-tender, non-distended MUSCULOSKELETAL:  2+ edema, stasis changes and marked varicosities No deformity  SKIN: Warm and dry NEUROLOGIC:  Alert and oriented x 3 PSYCHIATRIC:  Normal affect       Signed, Norman Herrlich, MD  10/07/2016 10:16 AM    [facility]

## 2016-10-08 LAB — BASIC METABOLIC PANEL
BUN/Creatinine Ratio: 20 (ref 12–28)
BUN: 24 mg/dL (ref 8–27)
CALCIUM: 8.8 mg/dL (ref 8.7–10.3)
CHLORIDE: 102 mmol/L (ref 96–106)
CO2: 26 mmol/L (ref 20–29)
Creatinine, Ser: 1.2 mg/dL — ABNORMAL HIGH (ref 0.57–1.00)
GFR calc Af Amer: 55 mL/min/{1.73_m2} — ABNORMAL LOW (ref >59)
GFR, EST NON AFRICAN AMERICAN: 48 mL/min/{1.73_m2} — AB (ref >59)
GLUCOSE: 85 mg/dL (ref 65–99)
POTASSIUM: 4.7 mmol/L (ref 3.5–5.2)
SODIUM: 142 mmol/L (ref 134–144)

## 2016-10-08 LAB — BRAIN NATRIURETIC PEPTIDE: BNP: 48.5 pg/mL (ref 0.0–100.0)

## 2016-10-08 LAB — PLEASE NOTE

## 2016-10-08 LAB — D-DIMER, QUANTITATIVE: D-DIMER: 0.95 mg/L FEU — ABNORMAL HIGH (ref 0.00–0.49)

## 2016-10-11 ENCOUNTER — Encounter: Payer: Self-pay | Admitting: Cardiology

## 2016-10-11 ENCOUNTER — Telehealth (HOSPITAL_COMMUNITY): Payer: Self-pay | Admitting: *Deleted

## 2016-10-11 ENCOUNTER — Telehealth: Payer: Self-pay

## 2016-10-11 DIAGNOSIS — R0602 Shortness of breath: Secondary | ICD-10-CM | POA: Diagnosis not present

## 2016-10-11 DIAGNOSIS — R079 Chest pain, unspecified: Secondary | ICD-10-CM | POA: Diagnosis not present

## 2016-10-11 DIAGNOSIS — R7989 Other specified abnormal findings of blood chemistry: Secondary | ICD-10-CM | POA: Diagnosis not present

## 2016-10-11 NOTE — Addendum Note (Signed)
Addended by: Sharene ButtersWELLS, MICHAELA E on: 10/11/2016 09:29 AM   Modules accepted: Orders

## 2016-10-11 NOTE — Telephone Encounter (Signed)
Left message to return call [facility].

## 2016-10-11 NOTE — Telephone Encounter (Signed)
Patient given detailed instructions per Myocardial Perfusion Study Information Sheet for the test on 10/14/16 at 0745. Patient notified to arrive 15 minutes early and that it is imperative to arrive on time for appointment to keep from having the test rescheduled.  If you need to cancel or reschedule your appointment, please call the office within 24 hours of your appointment. . Patient verbalized understanding.Briannon Boggio, Adelene IdlerCynthia W

## 2016-10-11 NOTE — Addendum Note (Signed)
Addended by: Ayesha MohairWELLS, Jailyn Langhorst E on: 10/11/2016 03:23 PM   Modules accepted: Orders

## 2016-10-11 NOTE — Addendum Note (Signed)
Addended by: Sharene ButtersWELLS, MICHAELA E on: 10/11/2016 08:49 AM   Modules accepted: Orders

## 2016-10-12 DIAGNOSIS — M7989 Other specified soft tissue disorders: Secondary | ICD-10-CM | POA: Diagnosis not present

## 2016-10-12 DIAGNOSIS — R7989 Other specified abnormal findings of blood chemistry: Secondary | ICD-10-CM | POA: Diagnosis not present

## 2016-10-12 NOTE — Telephone Encounter (Signed)
Patient returned call. Patient informed of [facility]. Advised patient I would call with results of vascular study when received. Patient verbalized understanding.

## 2016-10-13 NOTE — Telephone Encounter (Signed)
Patient informed that results of ultrasound did not show DVT in legs. Confirmed that patient does need to do her stress test tomorrow and keep her appointment with Dr. Dulce SellarMunley 11/05/16. Patient verbalized understanding.

## 2016-10-14 ENCOUNTER — Encounter (HOSPITAL_COMMUNITY): Payer: Medicare Other

## 2016-10-20 ENCOUNTER — Telehealth (HOSPITAL_COMMUNITY): Payer: Self-pay | Admitting: *Deleted

## 2016-10-20 NOTE — Telephone Encounter (Signed)
Left message on voicemail in reference to upcoming appointment scheduled for 10/22/16. Phone number given for a call back so details instructions can be given. Ricky AlaSmith, Vear Staton Jacqueline

## 2016-10-22 ENCOUNTER — Ambulatory Visit (HOSPITAL_COMMUNITY): Payer: Commercial Managed Care - PPO

## 2016-10-25 ENCOUNTER — Ambulatory Visit (HOSPITAL_COMMUNITY): Payer: Commercial Managed Care - PPO | Attending: Internal Medicine

## 2016-10-25 VITALS — Ht 67.0 in | Wt 200.0 lb

## 2016-10-25 DIAGNOSIS — I509 Heart failure, unspecified: Secondary | ICD-10-CM | POA: Diagnosis not present

## 2016-10-25 DIAGNOSIS — I110 Hypertensive heart disease with heart failure: Secondary | ICD-10-CM | POA: Insufficient documentation

## 2016-10-25 DIAGNOSIS — R0602 Shortness of breath: Secondary | ICD-10-CM

## 2016-10-25 DIAGNOSIS — I2510 Atherosclerotic heart disease of native coronary artery without angina pectoris: Secondary | ICD-10-CM | POA: Insufficient documentation

## 2016-10-25 DIAGNOSIS — R079 Chest pain, unspecified: Secondary | ICD-10-CM | POA: Diagnosis not present

## 2016-10-25 DIAGNOSIS — Z8249 Family history of ischemic heart disease and other diseases of the circulatory system: Secondary | ICD-10-CM | POA: Diagnosis not present

## 2016-10-25 LAB — MYOCARDIAL PERFUSION IMAGING
CHL CUP RESTING HR STRESS: 67 {beats}/min
LHR: 0.31
LVDIAVOL: 81 mL (ref 46–106)
LVSYSVOL: 27 mL
Peak HR: 90 {beats}/min
SDS: 5
SRS: 4
SSS: 9
TID: 0.93

## 2016-10-25 MED ORDER — TECHNETIUM TC 99M TETROFOSMIN IV KIT
10.6000 | PACK | Freq: Once | INTRAVENOUS | Status: AC | PRN
  Administered 2016-10-25: 10.6 via INTRAVENOUS
  Filled 2016-10-25: qty 11

## 2016-10-25 MED ORDER — TECHNETIUM TC 99M TETROFOSMIN IV KIT
30.4000 | PACK | Freq: Once | INTRAVENOUS | Status: AC | PRN
Start: 2016-10-25 — End: 2016-10-25
  Administered 2016-10-25: 30.4 via INTRAVENOUS
  Filled 2016-10-25: qty 31

## 2016-10-25 MED ORDER — REGADENOSON 0.4 MG/5ML IV SOLN
0.4000 mg | Freq: Once | INTRAVENOUS | Status: AC
  Administered 2016-10-25: 0.4 mg via INTRAVENOUS

## 2016-11-05 ENCOUNTER — Encounter: Payer: Self-pay | Admitting: Cardiology

## 2016-11-05 ENCOUNTER — Ambulatory Visit (INDEPENDENT_AMBULATORY_CARE_PROVIDER_SITE_OTHER): Payer: Commercial Managed Care - PPO | Admitting: Cardiology

## 2016-11-05 VITALS — BP 124/70 | HR 72 | Ht 67.0 in | Wt 200.0 lb

## 2016-11-05 DIAGNOSIS — J449 Chronic obstructive pulmonary disease, unspecified: Secondary | ICD-10-CM | POA: Diagnosis not present

## 2016-11-05 NOTE — Progress Notes (Signed)
Cardiology Office Note:    Date:  11/05/2016   ID:  Meagan Lamb, DOB 05/17/1950, MRN [id number]  PCP:  Noni Saupeedding, John F. II, MD  Cardiologist:  Norman HerrlichBrian Munley, MD    Referring MD: Noni Saupeedding, John F. II, MD    ASSESSMENT:    1. Chronic obstructive pulmonary disease, unspecified COPD type (HCC)    PLAN:    In order of problems listed above:  1. Stable, she requests referral to pulmonary and is interested in rehabilitation.   Next appointment: As needed   Medication Adjustments/Labs and Tests Ordered: Current medicines are reviewed at length with the patient today.  Concerns regarding medicines are outlined above.  Orders Placed This Encounter  Procedures  . Ambulatory referral to Pulmonology   No orders of the defined types were placed in this encounter.   Chief Complaint  Patient presents with  . Follow-up  . Edema    in both ankles.   . Shortness of Breath    dose have COPD and would like to be referred to a Pulmonologist     History of Present Illness:    Meagan Lamb is a 66 y.o. female with a hx of COPD and possible heart failure  last seen 1 month ago. Unfortunately she continues to smoke and is not referred herself to the quit Smart program. She is improved but remained short of breath with effort but no edema orthopnea chest pain palpitation or syncope. Her diagnostic testing shows no evidence of congestive heart failure or pulmonary thromboembolism. She wants to seek attention with pulmonary specialist and to explore cardiac and pulmonary rehabilitation. Compliance with diet, lifestyle and medications: Yes Past Medical History:  Diagnosis Date  . Anxiety   . Arthritis   . Asthma   . Blood transfusion    at 246 months old  . Complication of anesthesia    "takes her a long time to come out of it"  . COPD (chronic obstructive pulmonary disease) (HCC)   . Depression   . GERD (gastroesophageal reflux disease)   . H/O hiatal hernia   . Headache(784.0)     reoccuring migranes  . Mood disorder (HCC)   . Ovarian failure   . Peripheral vascular disease (HCC)    vein stripping due to poor circulation  . Recurrent upper respiratory infection (URI)    bronchitis March or April  . Shortness of breath    at times per patient  . Smoker 08/09/2011   Followed in Pulmonary clinic/ [facility]/ [facility]    . Stomach ulcer 10/05/2016  . Tobacco abuse     Past Surgical History:  Procedure Laterality Date  . ANTERIOR LAT LUMBAR FUSION  08/26/2011   Procedure: ANTERIOR LATERAL LUMBAR FUSION 1 LEVEL;  Surgeon: Venita Lickahari Brooks, MD;  Location: [facility] OR;  Service: Orthopedics;  Laterality: N/A;  XLIF WITH POSTERIOR SPINAL FUSION L3-4  . APPENDECTOMY    . CHOLECYSTECTOMY    . GALLBLADDER SURGERY  2009  . HERNIA REPAIR     umbilical  . LUMBAR DISC SURGERY    . LUMBAR FUSION  08/26/2011   L3  L4  . TUBAL LIGATION  1986  . VARICOSE VEIN SURGERY      Current Medications: Current Meds  Medication Sig  . ANORO ELLIPTA 62.5-25 MCG/INH AEPB Inhale 1 puff into the lungs daily.  Marland Kitchen. atorvastatin (LIPITOR) 20 MG tablet Take 1 tablet by mouth daily.  . fluticasone (FLONASE) 50 MCG/ACT nasal spray Place 2 sprays into both  nostrils Daily.  Marland Kitchen ipratropium-albuterol (DUONEB) 0.5-2.5 (3) MG/3ML SOLN Take 3 mLs by nebulization 3 (three) times daily.  . Probiotic Product (PROBIOTIC PO) Take 1 capsule by mouth daily.  . QUEtiapine (SEROQUEL) 50 MG tablet Take 1 tablet by mouth at bedtime as needed.     Allergies:   Codeine; Adhesive [tape]; Aspirin; Bee venom; Latex; Morphine and related; Penicillins; and Sulfur   Social History   Social History  . Marital status: Married    Spouse name: N/A  . Number of children: N/A  . Years of education: N/A   Social History Main Topics  . Smoking status: Current Every Day Smoker    Packs/day: 1.00    Years: 35.00    Types: Cigarettes  . Smokeless tobacco: Never Used  . Alcohol use No  . Drug use: No  . Sexual  activity: Yes   Other Topics Concern  . None   Social History Narrative  . None     Family History: The patient's family history includes Cancer in her mother; Diabetes in her mother; Heart disease in her father; Hypertension in her mother. ROS:   Please see the history of present illness.    All other systems reviewed and are negative.  EKGs/Labs/Other Studies Reviewed:    The following studies were reviewed today:  Chest CTA;: no PE Lower extremity venous duplex: no DVT Echo ; EF 55-60% normal diastolic function Gated SPECT MPI: Study Highlights     Nuclear stress EF: 66%. No wall motion abnormality's. No perfusion abnormalities. No ischemia.  There was no ST segment deviation noted during stress.  This is a low risk study.  The study is normal.   Donato Schultz, MD    Recent Labs: 10/07/2016: BNP 48.5; BUN 24; Creatinine, Ser 1.20; Potassium 4.7; Sodium 142  Recent Lipid Panel No results found for: CHOL, TRIG, HDL, CHOLHDL, VLDL, LDLCALC, LDLDIRECT  Physical Exam:    VS:  BP 124/70   Pulse 72   Ht 5\' 7"  (1.702 m)   Wt 200 lb (90.7 kg)   SpO2 97%   BMI 31.32 kg/m     Wt Readings from Last 3 Encounters:  11/05/16 200 lb (90.7 kg)  10/25/16 200 lb (90.7 kg)  10/07/16 200 lb 1.9 oz (90.8 kg)     GEN:  Well nourished, well developed in no acute distress HEENT: Normal NECK: No JVD; No carotid bruits LYMPHATICS: No lymphadenopathy CARDIAC: RRR, no murmurs, rubs, gallops RESPIRATORY:  Clear to auscultation without rales, wheezing or rhonchi  ABDOMEN: Soft, non-tender, non-distended MUSCULOSKELETAL:  No edema; No deformity  SKIN: Warm and dry NEUROLOGIC:  Alert and oriented x 3 PSYCHIATRIC:  Normal affect    Signed, Norman Herrlich, MD  11/05/2016 9:33 AM    [facility]

## 2016-11-05 NOTE — Patient Instructions (Signed)
Medication Instructions:  Your physician recommends that you continue on your current medications as directed. Please refer to the Current Medication list given to you today.   Labwork: None  Testing/Procedures: None  Follow-Up: Your physician recommends that you schedule a follow-up appointment if symptoms worsen or fail to improve.  You have been referred to Pulmonary.   Any Other Special Instructions Will Be Listed Below (If Applicable).     If you need a refill on your cardiac medications before your next appointment, please call your pharmacy.

## 2017-02-10 ENCOUNTER — Institutional Professional Consult (permissible substitution): Payer: Commercial Managed Care - PPO | Admitting: Pulmonary Disease

## 2017-02-28 ENCOUNTER — Ambulatory Visit (INDEPENDENT_AMBULATORY_CARE_PROVIDER_SITE_OTHER): Payer: Commercial Managed Care - PPO | Admitting: Pulmonary Disease

## 2017-02-28 ENCOUNTER — Encounter: Payer: Self-pay | Admitting: Pulmonary Disease

## 2017-02-28 ENCOUNTER — Ambulatory Visit (HOSPITAL_BASED_OUTPATIENT_CLINIC_OR_DEPARTMENT_OTHER)
Admission: RE | Admit: 2017-02-28 | Discharge: 2017-02-28 | Disposition: A | Discharge status: Home / Self Care | Payer: Commercial Managed Care - PPO | Source: Ambulatory Visit | Attending: Pulmonary Disease | Admitting: Pulmonary Disease

## 2017-02-28 VITALS — BP 137/84 | HR 74 | Ht 67.0 in | Wt 210.0 lb

## 2017-02-28 DIAGNOSIS — J449 Chronic obstructive pulmonary disease, unspecified: Secondary | ICD-10-CM | POA: Diagnosis not present

## 2017-02-28 DIAGNOSIS — R0602 Shortness of breath: Secondary | ICD-10-CM

## 2017-02-28 DIAGNOSIS — Z720 Tobacco use: Secondary | ICD-10-CM | POA: Diagnosis not present

## 2017-02-28 MED ORDER — FLUTICASONE-UMECLIDIN-VILANT 100-62.5-25 MCG/INH IN AEPB: 1.0000 | INHALATION_SPRAY | Freq: Every day | RESPIRATORY_TRACT | 0 refills | Status: DC

## 2017-02-28 NOTE — Addendum Note (Signed)
Addended by: Maurene CapesPOTTS, Krysten Veronica M on: 02/28/2017 02:25 PM   Modules accepted: Orders
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
## 2017-02-28 NOTE — Addendum Note (Signed)
Addended by: Maurene CapesPOTTS, Hazelgrace Bonham M on: 02/28/2017 03:11 PM   Modules accepted: Orders

## 2017-02-28 NOTE — Addendum Note (Signed)
Addended by: Maurene CapesPOTTS, Million Maharaj M on: 02/28/2017 02:15 PM   Modules accepted: Orders

## 2017-02-28 NOTE — Patient Instructions (Signed)
Tobacco abuse: Quit smoking right away Call 1 800 quit now in order to get free nicotine replacement from the state of [place]  COPD: We will check a spirometry test today to see if this is worsened compared to 2013 We will check a walking test today to check your oxygen level when you exert yourself We will check blood work: CBC with differential and serum IgE to look for evidence of an allergy that may be making your COPD worse We will check a chest x-ray to make sure there is nothing else going on Stop Anoro Take Trelegy 1 puff daily no matter how you feel  We will see you back in 2-3 weeks with the nurse practitioner to make sure things are getting better.

## 2017-03-02 LAB — CBC WITH DIFFERENTIAL
BASOS ABS: 0 10*3/uL (ref 0.0–0.2)
Basos: 1 %
EOS (ABSOLUTE): 0.2 10*3/uL (ref 0.0–0.4)
Eos: 2 %
Hematocrit: 43.8 % (ref 34.0–46.6)
Hemoglobin: 14.8 g/dL (ref 11.1–15.9)
IMMATURE GRANULOCYTES: 0 %
Immature Grans (Abs): 0 10*3/uL (ref 0.0–0.1)
LYMPHS ABS: 3 10*3/uL (ref 0.7–3.1)
Lymphs: 39 %
MCH: 32.6 pg (ref 26.6–33.0)
MCHC: 33.8 g/dL (ref 31.5–35.7)
MCV: 97 fL (ref 79–97)
MONOS ABS: 0.5 10*3/uL (ref 0.1–0.9)
Monocytes: 7 %
NEUTROS PCT: 51 %
Neutrophils Absolute: 3.9 10*3/uL (ref 1.4–7.0)
RBC: 4.54 x10E6/uL (ref 3.77–5.28)
RDW: 13.7 % (ref 12.3–15.4)
WBC: 7.7 10*3/uL (ref 3.4–10.8)

## 2017-03-02 LAB — IGE: IgE (Immunoglobulin E), Serum: 133 IU/mL — ABNORMAL HIGH (ref 0–100)

## 2017-03-10 ENCOUNTER — Telehealth: Payer: Self-pay | Admitting: Pulmonary Disease

## 2017-03-10 MED ORDER — FLUTICASONE-UMECLIDIN-VILANT 100-62.5-25 MCG/INH IN AEPB: 1.0000 | INHALATION_SPRAY | Freq: Every day | RESPIRATORY_TRACT | 5 refills | Status: DC

## 2017-03-10 NOTE — Telephone Encounter (Signed)
Patient was seen on 02/28/17 by Dr. Kendrick FriesMcQuaid and was given a sample of Trelegy. Patient called today and stated that the Trelegy worked well for her and she would like to have this called into [facility] in [place].   Advised patient that this would be called into today for her. Patient verbalized understanding. Nothing else needed at time of call.

## 2017-03-17 ENCOUNTER — Encounter: Payer: Self-pay | Admitting: Adult Health

## 2017-03-17 ENCOUNTER — Ambulatory Visit (INDEPENDENT_AMBULATORY_CARE_PROVIDER_SITE_OTHER): Payer: Commercial Managed Care - PPO | Admitting: Adult Health

## 2017-03-17 DIAGNOSIS — F17200 Nicotine dependence, unspecified, uncomplicated: Secondary | ICD-10-CM | POA: Diagnosis not present

## 2017-03-17 DIAGNOSIS — J449 Chronic obstructive pulmonary disease, unspecified: Secondary | ICD-10-CM | POA: Diagnosis not present

## 2017-03-17 NOTE — Patient Instructions (Signed)
Check with Primary MD to see which Pneumonia vaccines you have received.  Continue on TRELEGY 1 puff daily , rinse after use.  Work on not smoking.  Follow up Dr. Kendrick FriesMcQuaid in 3-4 months and As needed

## 2017-03-17 NOTE — Assessment & Plan Note (Signed)
Severe COPD with improved symptom control on TRELEGY   Plan  Patient Instructions  Check with Primary MD to see which Pneumonia vaccines you have received.  Continue on TRELEGY 1 puff daily , rinse after use.  Work on not smoking.  Follow up Dr. Kendrick FriesMcQuaid in 3-4 months and As needed

## 2017-03-17 NOTE — Assessment & Plan Note (Signed)
Smoking cessation  

## 2017-03-17 NOTE — Progress Notes (Signed)
Reviewed, agree 

## 2017-03-17 NOTE — Progress Notes (Signed)
 @Patient  ID: Meagan Lamb, female    DOB: 1950-07-26, 66 y.o.   MRN: [id number]  Chief Complaint  Patient presents with  . Follow-up    COPD     Referring provider: Noni Saupeedding, John F. II, MD  HPI: 66 yo female smoker seen for pulmonary consult 02/2017 for COPD   Chest imaging: August 2018 CT angiogram chest showed no pulmonary embolism, minimal atelectasis bases.  Images independently reviewed, there is really no significant emphysema to be seen.  PFT: May 2013 spirometry ratio 59%, FEV1 1.71 L 61% predicted  03/17/2017 Follow up ; COPD  Pt returns for 2 week follow up . She was seen last office visit for pulmonary consult for COPD .  Patient is a heavy smoker and was starting to have progressive shortness of breath.  Spirometry was done and showed progressive COPD with an FEV1 PICC line at 45% ratio 67 and FVC 52%.  Patient was encouraged on smoking cessation.  [facility] .  Patient does feel that the new inhaler is helping some.  She still gets short of breath with activities.  And does have some intermittent cough and thick mucus. Chest x-ray showed COPD without acute process noted.  IgE was 133.  CBC with differential was normal   Allergies  Allergen Reactions  . Codeine Shortness Of Breath    Hives and difficulty interaction  . Adhesive [Tape] Other (See Comments)    Skin tears-may use paper tape.  . Aspirin     SENSITIVITY--PT CAN TAKE 81MG  AS LONG AS COATED!  Marland Kitchen. Bee Venom     Rash/hives Possible shock  . Latex     sensitivity  . Morphine And Related     Burning rash   . Penicillins     Rash   . Sulfur     Rash     Immunization History  Administered Date(s) Administered  . Influenza Split 06/09/2011  . Pneumococcal Conjugate-13 03/29/2008, 05/12/2011    Past Medical History:  Diagnosis Date  . Anxiety   . Arthritis   . Asthma   . Blood transfusion    at 936 months old  . Complication of anesthesia    "takes her a long  time to come out of it"  . COPD (chronic obstructive pulmonary disease) (HCC)   . Depression   . GERD (gastroesophageal reflux disease)   . H/O hiatal hernia   . Headache(784.0)    reoccuring migranes  . Mood disorder (HCC)   . Ovarian failure   . Peripheral vascular disease (HCC)    vein stripping due to poor circulation  . Recurrent upper respiratory infection (URI)    bronchitis March or April  . Shortness of breath    at times per patient  . Smoker 08/09/2011   Followed in Pulmonary clinic/  [facility]/ [place]    . Stomach ulcer 10/05/2016  . Tobacco abuse     Tobacco History: Social History   Tobacco Use  Smoking Status Current Every Day Smoker  . Packs/day: 1.00  . Years: 35.00  . Pack years: 35.00  . Types: Cigarettes  Smokeless Tobacco Never Used   Ready to quit: Yes Counseling given: Yes   Outpatient Encounter Medications as of 03/17/2017  Medication Sig  . albuterol (PROVENTIL HFA;VENTOLIN HFA) 108 (90 Base) MCG/ACT inhaler Inhale 2 puffs into the lungs every 6 (six) hours as needed for wheezing or shortness of breath.  Marland Kitchen. atorvastatin (LIPITOR) 20 MG tablet Take  1 tablet by mouth daily.  . fluticasone (FLONASE) 50 MCG/ACT nasal spray Place 2 sprays into both nostrils Daily.  . Fluticasone-Umeclidin-Vilant (TRELEGY ELLIPTA) 100-62.5-25 MCG/INH AEPB Inhale 1 puff into the lungs daily.  Marland Kitchen. ipratropium-albuterol (DUONEB) 0.5-2.5 (3) MG/3ML SOLN Take 3 mLs by nebulization 3 (three) times daily.  . Probiotic Product (PROBIOTIC PO) Take 1 capsule by mouth daily.  . QUEtiapine (SEROQUEL) 50 MG tablet Take 1 tablet by mouth at bedtime as needed.  Ailene Ards. ANORO ELLIPTA 62.5-25 MCG/INH AEPB Inhale 1 puff into the lungs daily.   No facility-administered encounter medications on file as of 03/17/2017.      Review of Systems  Constitutional:   No  weight loss, night sweats,  Fevers, chills,  +fatigue, or  lassitude.  HEENT:   No headaches,  Difficulty swallowing,   Tooth/dental problems, or  Sore throat,                No sneezing, itching, ear ache,  +nasal congestion, post nasal drip,   CV:  No chest pain,  Orthopnea, PND, swelling in lower extremities, anasarca, dizziness, palpitations, syncope.   GI  No heartburn, indigestion, abdominal pain, nausea, vomiting, diarrhea, change in bowel habits, loss of appetite, bloody stools.   Resp:    No chest wall deformity  Skin: no rash or lesions.  GU: no dysuria, change in color of urine, no urgency or frequency.  No flank pain, no hematuria   MS:  No joint pain or swelling.  No decreased range of motion.  No back pain.    Physical Exam  BP 136/86 (BP Location: Left Arm, Cuff Size: Normal)   Pulse 67   Ht 5\' 7"  (1.702 m)   Wt 210 lb (95.3 kg)   SpO2 93%   BMI 32.89 kg/m   GEN: A/Ox3; pleasant , NAD, elderly    HEENT:  [place]/AT,  EACs-clear, TMs-wnl, NOSE-clear, THROAT-clear, no lesions, no postnasal drip or exudate noted.   NECK:  Supple w/ fair ROM; no JVD; normal carotid impulses w/o bruits; no thyromegaly or nodules palpated; no lymphadenopathy.    RESP diminished breath sounds in the bases  no accessory muscle use, no dullness to percussion  CARD:  RRR, no m/r/g, no peripheral edema, pulses intact, no cyanosis or clubbing.  GI:   Soft & nt; nml bowel sounds; no organomegaly or masses detected.   Musco: Warm bil, no deformities or joint swelling noted.   Neuro: alert, no focal deficits noted.    Skin: Warm, no lesions or rashes    Lab Results:  CBC    Component Value Date/Time   WBC 7.7 02/28/2017 1433   WBC 7.6 08/20/2011 0931   RBC 4.54 02/28/2017 1433   RBC 4.83 08/20/2011 0931   HGB 14.8 02/28/2017 1433   HCT 43.8 02/28/2017 1433   PLT 199 08/20/2011 0931   MCV 97 02/28/2017 1433   MCH 32.6 02/28/2017 1433   MCH 33.3 08/20/2011 0931   MCHC 33.8 02/28/2017 1433   MCHC 33.6 08/20/2011 0931   RDW 13.7 02/28/2017 1433   LYMPHSABS 3.0 02/28/2017 1433   MONOABS 0.5  08/20/2011 0931   EOSABS 0.2 02/28/2017 1433   BASOSABS 0.0 02/28/2017 1433    BMET BNP    Component Value Date/Time   BNP 48.5 10/07/2016 0000    ProBNP No results found for: PROBNP  Imaging: Dg Chest 2 View  Result Date: 02/28/2017 CLINICAL DATA:  COPD short of breath EXAM: CHEST  2 VIEW COMPARISON:  09/27/2016 FINDINGS: COPD with hyperinflation. Lungs are clear without infiltrate effusion or mass. Mild scarring in the lingula. Atherosclerotic aorta. Negative for heart failure. IMPRESSION: COPD without acute radiographic abnormality. Electronically Signed   By: Marlan Palau M.D.   On: 02/28/2017 17:03     Assessment & Plan:   COPD (chronic obstructive pulmonary disease) Severe COPD with improved symptom control on TRELEGY   Plan  Patient Instructions  Check with Primary MD to see which Pneumonia vaccines you have received.  Continue on TRELEGY 1 puff daily , rinse after use.  Work on not smoking.  Follow up Dr. Kendrick Fries in 3-4 months and As needed       Smoker Smoking cessation .      Rubye Oaks, NP 03/17/2017

## 2017-06-30 ENCOUNTER — Ambulatory Visit: Payer: Commercial Managed Care - PPO | Admitting: Adult Health

## 2017-07-14 ENCOUNTER — Ambulatory Visit (INDEPENDENT_AMBULATORY_CARE_PROVIDER_SITE_OTHER): Payer: Commercial Managed Care - PPO | Admitting: Adult Health

## 2017-07-14 ENCOUNTER — Encounter: Payer: Self-pay | Admitting: Adult Health

## 2017-07-14 DIAGNOSIS — F17200 Nicotine dependence, unspecified, uncomplicated: Secondary | ICD-10-CM

## 2017-07-14 DIAGNOSIS — I5032 Chronic diastolic (congestive) heart failure: Secondary | ICD-10-CM | POA: Diagnosis not present

## 2017-07-14 DIAGNOSIS — J449 Chronic obstructive pulmonary disease, unspecified: Secondary | ICD-10-CM

## 2017-07-14 MED ORDER — VARENICLINE TARTRATE 0.5 MG X 11 & 1 MG X 42 PO MISC: ORAL | 0 refills | Status: DC

## 2017-07-14 MED ORDER — PREDNISONE 10 MG PO TABS: ORAL_TABLET | ORAL | 0 refills | Status: DC

## 2017-07-14 MED ORDER — VARENICLINE TARTRATE 1 MG PO TABS: 1.0000 mg | ORAL_TABLET | Freq: Two times a day (BID) | ORAL | 3 refills | Status: DC

## 2017-07-14 NOTE — Assessment & Plan Note (Signed)
Smoking cessation  chantix rx ,  Pt education on chantix and smoking cessation given

## 2017-07-14 NOTE — Patient Instructions (Addendum)
Prednisone taper over next week.  Mucinex DM Twice daily  As needed  Cough /congestion .  Add Zyrtec 10mg  At bedtime  As needed  Drainage .  Continue on Flonase 2 puffs daily.  Continue on TRELEGY 1 puff daily , rinse after use.  Work on quitting smoking  Begin [name] starter pack.  Follow up with Dr. Kendrick FriesMcQuaid 3-4 months and As needed   Please contact office for sooner follow up if symptoms do not improve or worsen or seek emergency care

## 2017-07-14 NOTE — Assessment & Plan Note (Signed)
Appears compensated without evidence of volume overload on exam 

## 2017-07-14 NOTE — Assessment & Plan Note (Signed)
Mild flare with allergic rhinitis  Plan  Patient Instructions  Prednisone taper over next week.  Mucinex DM Twice daily  As needed  Cough /congestion .  Add Zyrtec 10mg  At bedtime  As needed  Drainage .  Continue on Flonase 2 puffs daily.  Continue on TRELEGY 1 puff daily , rinse after use.  Work on quitting smoking  Begin [name] starter pack.  Follow up with Dr. Kendrick FriesMcQuaid 3-4 months and As needed   Please contact office for sooner follow up if symptoms do not improve or worsen or seek emergency care

## 2017-07-14 NOTE — Progress Notes (Signed)
@Patient  ID: Meagan Lamb, female    DOB: 06/08/1950, 67 y.o.   MRN: [id number]  Chief Complaint  Patient presents with  . Follow-up    COPD     Referring provider: Noni Saupe, MD  HPI: 67 yo female smoker seen for pulmonary consult 02/2017 for COPD   Chest imaging: August 2018 CT angiogram chest showed no pulmonary embolism, minimal atelectasis bases.Images independently reviewed, there is really no significantemphysemato beseen.  PFT: May 2013 spirometry ratio 59%, FEV1 1.71 L 61% predicted December 2018 spirometry FEV1 45%, ratio 67, FVC 52%. Chest x-ray December 2018 shows COPD changes. CT chest July 2018- for PE, lungs clear 2D echo July 2018 EF normal July 2018 Myoview EF 66%, normal study  07/14/2017 Follow up: Severe COPD  Patient returns for a 41-month follow-up.  She says her breathing has been doing okay overall until the last 2-3 weeks.  She has noticed increased watery eyes nasal congestion drainage cough and intermittent wheezing and tightness.  She has had increase her Proventil use.  She remains on TRELEGY daily.  She denies any fever, discolored mucus, chest pain, orthopnea, edema.  Patient does continue to smoke.  Smoking cessation was discussed.  Patient says she has tried [name] in the past and this seemed to work the best for her and would like a prescription for this.  We discussed patient education regarding [name].     Allergies  Allergen Reactions  . Codeine Shortness Of Breath    Hives and difficulty interaction  . Adhesive [Tape] Other (See Comments)    Skin tears-may use paper tape.  . Aspirin     SENSITIVITY--PT CAN TAKE 81MG  AS LONG AS COATED!  Marland Kitchen Bee Venom     Rash/hives Possible shock  . Latex     sensitivity  . Morphine And Related     Burning rash   . Penicillins     Rash   . Sulfur     Rash     Immunization History  Administered Date(s) Administered  . Influenza Split 06/09/2011  . Pneumococcal  Conjugate-13 03/29/2008, 05/12/2011    Past Medical History:  Diagnosis Date  . Anxiety   . Arthritis   . Asthma   . Blood transfusion    at 60 months old  . Complication of anesthesia    "takes her a long time to come out of it"  . COPD (chronic obstructive pulmonary disease) (HCC)   . Depression   . GERD (gastroesophageal reflux disease)   . H/O hiatal hernia   . Headache(784.0)    reoccuring migranes  . Mood disorder (HCC)   . Ovarian failure   . Peripheral vascular disease (HCC)    vein stripping due to poor circulation  . Recurrent upper respiratory infection (URI)    bronchitis March or April  . Shortness of breath    at times per patient  . Smoker 08/09/2011   Followed in Pulmonary clinic/ [facility]/ [facility]    . Stomach ulcer 10/05/2016  . Tobacco abuse     Tobacco History: Social History   Tobacco Use  Smoking Status Current Every Day Smoker  . Packs/day: 1.50  . Years: 35.00  . Pack years: 52.50  . Types: Cigarettes  Smokeless Tobacco Never Used   Ready to quit: No Counseling given: Yes   Outpatient Encounter Medications as of 07/14/2017  Medication Sig  . albuterol (PROVENTIL HFA;VENTOLIN HFA) 108 (90 Base) MCG/ACT inhaler Inhale 2 puffs into the  lungs every 6 (six) hours as needed for wheezing or shortness of breath.  Marland Kitchen. atorvastatin (LIPITOR) 20 MG tablet Take 1 tablet by mouth daily.  . fluticasone (FLONASE) 50 MCG/ACT nasal spray Place 2 sprays into both nostrils Daily.  . Fluticasone-Umeclidin-Vilant (TRELEGY ELLIPTA) 100-62.5-25 MCG/INH AEPB Inhale 1 puff into the lungs daily.  Marland Kitchen. ipratropium-albuterol (DUONEB) 0.5-2.5 (3) MG/3ML SOLN Take 3 mLs by nebulization 3 (three) times daily.  . Probiotic Product (PROBIOTIC PO) Take 1 capsule by mouth daily.  . QUEtiapine (SEROQUEL) 50 MG tablet Take 1 tablet by mouth at bedtime as needed.  . predniSONE (DELTASONE) 10 MG tablet 4 tabs for 2 days, then 3 tabs for 2 days, 2 tabs for 2 days, then 1 tab for  2 days, then stop  . varenicline ([name] CONTINUING MONTH PAK) 1 MG tablet Take 1 tablet (1 mg total) by mouth 2 (two) times daily.  . varenicline ([name] STARTING MONTH PAK) 0.5 MG X 11 & 1 MG X 42 tablet Take one 0.5 mg tab once daily for 3 days, then increase to one 0.5 mg tab twice daily for 4 days, then increase to one 1 mg tab twice daily  . [DISCONTINUED] ANORO ELLIPTA 62.5-25 MCG/INH AEPB Inhale 1 puff into the lungs daily.  . [DISCONTINUED] varenicline ([name] STARTING MONTH PAK) 0.5 MG X 11 & 1 MG X 42 tablet Take one 0.5 mg tablet by mouth once daily for 3 days, then increase to one 0.5 mg tablet twice daily for 4 days, then increase to one 1 mg tablet twice daily.   No facility-administered encounter medications on file as of 07/14/2017.      Review of Systems  Constitutional:   No  weight loss, night sweats,  Fevers, chills, fatigue, or  lassitude.  HEENT:   No headaches,  Difficulty swallowing,  Tooth/dental problems, or  Sore throat,                No sneezing, itching, ear ache, + nasal congestion, post nasal drip,   CV:  No chest pain,  Orthopnea, PND, swelling in lower extremities, anasarca, dizziness, palpitations, syncope.   GI  No heartburn, indigestion, abdominal pain, nausea, vomiting, diarrhea, change in bowel habits, loss of appetite, bloody stools.   Resp:   No chest wall deformity  Skin: no rash or lesions.  GU: no dysuria, change in color of urine, no urgency or frequency.  No flank pain, no hematuria   MS:  No joint pain or swelling.  No decreased range of motion.  No back pain.    Physical Exam  BP 131/74 (BP Location: Right Arm, Cuff Size: Normal)   Pulse 67   Ht 5\' 7"  (1.702 m)   Wt 209 lb (94.8 kg)   SpO2 94%   BMI 32.73 kg/m   GEN: A/Ox3; pleasant , NAD, elderly   HEENT:  [place]/AT,  EACs-clear, TMs-wnl, NOSE-clear drainage, THROAT-clear, no lesions, no postnasal drip or exudate noted.   NECK:  Supple w/ fair ROM; no JVD; normal carotid  impulses w/o bruits; no thyromegaly or nodules palpated; no lymphadenopathy.    RESP  Clear  P & A; w/o, wheezes/ rales/ or rhonchi. no accessory muscle use, no dullness to percussion  CARD:  RRR, no m/r/g, no peripheral edema, pulses intact, no cyanosis or clubbing.  GI:   Soft & nt; nml bowel sounds; no organomegaly or masses detected.   Musco: Warm bil, no deformities or joint swelling noted.   Neuro: alert, no  focal deficits noted.    Skin: Warm, no lesions or rashes    Lab Results:  CBC  BMET  No results found for: PROBNP  Imaging: No results found.   Assessment & Plan:   COPD (chronic obstructive pulmonary disease) Mild flare with allergic rhinitis  Plan  Patient Instructions  Prednisone taper over next week.  Mucinex DM Twice daily  As needed  Cough /congestion .  Add Zyrtec 10mg  At bedtime  As needed  Drainage .  Continue on Flonase 2 puffs daily.  Continue on TRELEGY 1 puff daily , rinse after use.  Work on quitting smoking  Begin [name] starter pack.  Follow up with Dr. Kendrick Fries 3-4 months and As needed   Please contact office for sooner follow up if symptoms do not improve or worsen or seek emergency care        Smoker Smoking cessation  chantix rx ,  Pt education on chantix and smoking cessation given   Chronic diastolic heart failure (HCC) Appears compensated without evidence of volume overload on exam     Rubye Oaks, NP 07/14/2017

## 2017-07-18 ENCOUNTER — Telehealth: Payer: Self-pay | Admitting: Adult Health

## 2017-07-18 NOTE — Telephone Encounter (Signed)
Meagan CaperKenneth, [facility], calling about PA for [name].  Wanting to know if patient has tried bupropion and if not why they would not be candidate for this first.  CB is [phone], GN#[id number].

## 2017-07-18 NOTE — Telephone Encounter (Signed)
Called [facility] to provide additional clinical info for PA.  PA is being forwarded to clinical team for review, and a determination is expected within 3-14 business days.   Routing back to JJ for follow-up.

## 2017-07-18 NOTE — Telephone Encounter (Signed)
Spoke with a respresentative from [facility] who stated the patient was denied authorization due to not having tried Zyban first.   TP please advise if you would like to order this for the patient instead of [name].  Routing to JJ for further follow up

## 2017-07-18 NOTE — Telephone Encounter (Signed)
[place] [facility] [place] C[facility] Key: [id number] request has been sent to plan, and a determination is expected within 3 days.   Routing to JJ for follow-up.

## 2017-07-19 NOTE — Telephone Encounter (Signed)
I was able to talk to patient and she said she has tried the Zyban before and that is why her doctor started her on the [name] because it did not work for her.  She states that Dr. Jeanie Seweredding prescribed it for her.  I will move forward with getting [name] for this patient.

## 2017-07-19 NOTE — Telephone Encounter (Signed)
Per TP: let's ask the patient what she wants to do.  She's been on [name] before and it helped.  Thanks.

## 2017-07-19 NOTE — Telephone Encounter (Signed)
New PA request sent due to patient already trying Zyban in past New CMM Key: [id number] PA request has been sent to plan, and a determination is expected within 72 HOURS.   Routing to JJ for follow-up.

## 2017-07-20 NOTE — Telephone Encounter (Signed)
[name] was approved from 07/19/17-07/20/18  Approval number- [id number]  [facility] and they stated will get it ready and inform the pt

## 2017-07-28 NOTE — Progress Notes (Signed)
Reviewed/agree

## 2017-08-08 DIAGNOSIS — Z23 Encounter for immunization: Secondary | ICD-10-CM | POA: Diagnosis not present

## 2017-08-08 DIAGNOSIS — Z9181 History of falling: Secondary | ICD-10-CM | POA: Diagnosis not present

## 2017-08-08 DIAGNOSIS — K219 Gastro-esophageal reflux disease without esophagitis: Secondary | ICD-10-CM | POA: Diagnosis not present

## 2017-08-08 DIAGNOSIS — F17200 Nicotine dependence, unspecified, uncomplicated: Secondary | ICD-10-CM | POA: Diagnosis not present

## 2017-08-08 DIAGNOSIS — Z0000 Encounter for general adult medical examination without abnormal findings: Secondary | ICD-10-CM | POA: Diagnosis not present

## 2017-08-08 DIAGNOSIS — G4700 Insomnia, unspecified: Secondary | ICD-10-CM | POA: Diagnosis not present

## 2017-09-10 ENCOUNTER — Other Ambulatory Visit: Payer: Self-pay | Admitting: Pulmonary Disease

## 2017-12-21 ENCOUNTER — Other Ambulatory Visit: Payer: Self-pay | Admitting: Adult Health

## 2018-03-13 ENCOUNTER — Other Ambulatory Visit: Payer: Self-pay | Admitting: Pulmonary Disease

## 2018-04-14 ENCOUNTER — Telehealth: Payer: Self-pay | Admitting: Pulmonary Disease

## 2018-04-14 MED ORDER — FLUTICASONE-UMECLIDIN-VILANT 100-62.5-25 MCG/INH IN AEPB: 1.0000 | INHALATION_SPRAY | Freq: Every day | RESPIRATORY_TRACT | 1 refills | Status: DC

## 2018-04-14 NOTE — Telephone Encounter (Signed)
Spoke with pt. She is needing a refill on Trelegy. Advised her that she is overdue for a follow up appointment with BQ. This has been scheduled for 05/30/2018 at 12pm. [facility] [place] [facility]. Rx has been sent in. Nothing further was needed.

## 2018-05-30 ENCOUNTER — Ambulatory Visit (INDEPENDENT_AMBULATORY_CARE_PROVIDER_SITE_OTHER): Payer: Commercial Managed Care - PPO | Admitting: Pulmonary Disease

## 2018-05-30 ENCOUNTER — Encounter: Payer: Self-pay | Admitting: Pulmonary Disease

## 2018-05-30 VITALS — BP 130/70 | HR 72 | Ht 63.5 in | Wt 213.5 lb

## 2018-05-30 DIAGNOSIS — R05 Cough: Secondary | ICD-10-CM

## 2018-05-30 DIAGNOSIS — R059 Cough, unspecified: Secondary | ICD-10-CM

## 2018-05-30 LAB — POCT INFLUENZA A/B
Influenza A, POC: NEGATIVE
Influenza B, POC: NEGATIVE

## 2018-05-30 MED ORDER — FLUTICASONE-UMECLIDIN-VILANT 100-62.5-25 MCG/INH IN AEPB: 1.0000 | INHALATION_SPRAY | Freq: Every day | RESPIRATORY_TRACT | 5 refills | Status: DC

## 2018-05-30 MED ORDER — AZITHROMYCIN 250 MG PO TABS: ORAL_TABLET | ORAL | 0 refills | Status: DC

## 2018-05-30 MED ORDER — VARENICLINE TARTRATE 0.5 MG X 11 & 1 MG X 42 PO MISC: ORAL | 0 refills | Status: DC

## 2018-05-30 MED ORDER — PREDNISONE 20 MG PO TABS: 20.0000 mg | ORAL_TABLET | Freq: Every day | ORAL | 0 refills | Status: DC

## 2018-05-30 NOTE — Patient Instructions (Signed)
Flulike illness: Check flu swab Practice good hand hygiene Drink plenty of fluid  COPD, severe with acute exacerbation: Prednisone 20 mg daily x5 days Z-Pak, take as directed Continue Trelegy 1 puff daily no matter how you feel Use albuterol as needed for chest tightness wheezing or shortness of breath Stay physically active  Cigarette smoker: Do your best to stop smoking right away We will prescribe Trelegy once again, please call us if you have abnormal thoughts or feelings while taking this medicine  We will see you back in 2 to 3 weeks with a nurse practitioner or sooner if needed

## 2018-05-30 NOTE — Progress Notes (Signed)
Subjective:   PATIENT ID: Meagan Lamb GENDER: female DOB: 05-27-1950, MRN: [id number]  Synopsis: Referred in December 2018  for COPD  HPI  Chief Complaint  Patient presents with  . Follow-up    follows for SOB   Meagan Lamb returns to clinic today with a chief complaint of cough, green mucus production, and increasing shortness of breath.  She says she has had the symptoms for a week.  She also has some body aches but no fever or chills.  She says that multiple family members have been sick, some with strep throat, some with influenza.  She denies fever herself.  However, she says she has a scratchy throat and a lot of sinus symptoms that go along with her respiratory complaints as detailed above.  No GI complaints.  She continues to take Trelegy 1 puff daily no matter how she feels.  She has been using albuterol as needed which helps as well.  No recent international travel.    Past Medical History:  Diagnosis Date  . Anxiety   . Arthritis   . Asthma   . Blood transfusion    at 29 months old  . Complication of anesthesia    "takes her a long time to come out of it"  . COPD (chronic obstructive pulmonary disease) (HCC)   . Depression   . GERD (gastroesophageal reflux disease)   . H/O hiatal hernia   . Headache(784.0)    reoccuring migranes  . Mood disorder (HCC)   . Ovarian failure   . Peripheral vascular disease (HCC)    vein stripping due to poor circulation  . Recurrent upper respiratory infection (URI)    bronchitis March or April  . Shortness of breath    at times per patient  . Smoker 08/09/2011   Followed in Pulmonary clinic/ [facility]/ [place]    . Stomach ulcer 10/05/2016  . Tobacco abuse       Review of Systems  Constitutional: Negative for chills, fever, malaise/fatigue and weight loss.  HENT: Positive for sinus pain. Negative for congestion, nosebleeds and sore throat.   Eyes: Negative for photophobia, pain and discharge.  Respiratory: Positive  for cough and shortness of breath. Negative for hemoptysis, sputum production and wheezing.   Cardiovascular: Negative for chest pain, palpitations and orthopnea.  Gastrointestinal: Negative for abdominal pain, constipation, diarrhea, nausea and vomiting.  Genitourinary: Negative for dysuria, frequency, hematuria and urgency.  Musculoskeletal: Negative for back pain, joint pain, myalgias and neck pain.  Skin: Negative for itching and rash.  Neurological: Negative for tingling, tremors, sensory change, speech change, focal weakness, seizures, weakness and headaches.  Psychiatric/Behavioral: Negative for memory loss, substance abuse and suicidal ideas. The patient is not nervous/anxious.       Objective:  Physical Exam   Vitals:   05/30/18 1208  BP: 130/70  Pulse: 72  SpO2: 96%  Weight: 213 lb 8 oz (96.8 kg)  Height: 5' 3.5" (1.613 m)    Gen: coughing, mildly ill appearing HENT: OP clear, TM's clear, neck supple PULM: Poor air movement, no wheezing B, normal percussion CV: RRR, no mgr, trace edema GI: BS+, soft, nontender Derm: no cyanosis or rash Psyche: normal mood and affect   CBC    Component Value Date/Time   WBC 7.7 02/28/2017 1433   WBC 7.6 08/20/2011 0931   RBC 4.54 02/28/2017 1433   RBC 4.83 08/20/2011 0931   HGB 14.8 02/28/2017 1433   HCT 43.8 02/28/2017 1433  PLT 199 08/20/2011 0931   MCV 97 02/28/2017 1433   MCH 32.6 02/28/2017 1433   MCH 33.3 08/20/2011 0931   MCHC 33.8 02/28/2017 1433   MCHC 33.6 08/20/2011 0931   RDW 13.7 02/28/2017 1433   LYMPHSABS 3.0 02/28/2017 1433   MONOABS 0.5 08/20/2011 0931   EOSABS 0.2 02/28/2017 1433   BASOSABS 0.0 02/28/2017 1433     Chest imaging: August 2018 CT angiogram chest showed no pulmonary embolism, minimal atelectasis bases.  Images independently reviewed, there is really no significant emphysema to be seen. 02/2017 CXR : emphysema, images personally reviewed  PFT: May 2013 spirometry ratio 59%, FEV1  1.71 L 61% predicted 02/2017 ratio 67% FEV1 1.2L (45% pred)  Labs: 02/2017 IgE 133 IU/mL, 200 eos/dL  Path:  Echo:  Heart Catheterization:        Assessment & Plan:   No diagnosis found.  Discussion: Meagan Lamb returns to clinic complaining of symptoms consistent with an acute exacerbation of her severe COPD.  Specifically cough with green mucus production is consistent with this.  I do worry that that may have been started by influenza though she is currently out of the window to benefit from Tamiflu.  I think it is worthwhile testing her though so that she can isolate herself from other family members to keep from spreading it further.  Plan: Flulike illness: Check flu swab Practice good hand hygiene Drink plenty of fluid  COPD, severe with acute exacerbation: Prednisone 20 mg daily x5 days Z-Pak, take as directed Continue Trelegy 1 puff daily no matter how you feel Use albuterol as needed for chest tightness wheezing or shortness of breath Stay physically active  Cigarette smoker: Do your best to stop smoking right away We will prescribe Trelegy once again, please call us if you have abnormal thoughts or feelings while taking this medicine  We will see you back in 2 to 3 weeks with a nurse practitioner or sooner if needed     Current Outpatient Medications:  .  albuterol (PROVENTIL HFA;VENTOLIN HFA) 108 (90 Base) MCG/ACT inhaler, Inhale 2 puffs into the lungs every 6 (six) hours as needed for wheezing or shortness of breath., Disp: , Rfl:  .  atorvastatin (LIPITOR) 20 MG tablet, Take 1 tablet by mouth daily., Disp: , Rfl: 3 .  BIOTIN 5000 PO, Take 2 tablets by mouth daily., Disp: , Rfl:  .  Calcium Carbonate-Vit D-Min (CALCIUM 1200 PO), Take by mouth., Disp: , Rfl:  .  Cranberry-Vitamin C-Vitamin E 4200-20-3 MG-MG-UNIT CAPS, Take 1 tablet by mouth daily., Disp: , Rfl:  .  Cyanocobalamin (B-12) 2500 MCG TABS, Take 1 tablet by mouth daily., Disp: , Rfl:  .  fluticasone  (FLONASE) 50 MCG/ACT nasal spray, Place 2 sprays into both nostrils Daily., Disp: , Rfl:  .  Fluticasone-Umeclidin-Vilant (TRELEGY ELLIPTA) 100-62.5-25 MCG/INH AEPB, Inhale 1 puff into the lungs daily., Disp: 60 each, Rfl: 1 .  ibuprofen (ADVIL,MOTRIN) 200 MG tablet, Take 200 mg by mouth 4 (four) times daily., Disp: , Rfl:  .  ipratropium-albuterol (DUONEB) 0.5-2.5 (3) MG/3ML SOLN, Take 3 mLs by nebulization 3 (three) times daily., Disp: , Rfl:  .  QUEtiapine (SEROQUEL) 50 MG tablet, Take 1 tablet by mouth at bedtime as needed., Disp: , Rfl: 3

## 2018-06-15 ENCOUNTER — Ambulatory Visit (INDEPENDENT_AMBULATORY_CARE_PROVIDER_SITE_OTHER): Payer: Commercial Managed Care - PPO | Admitting: Nurse Practitioner

## 2018-06-15 ENCOUNTER — Encounter: Payer: Self-pay | Admitting: Nurse Practitioner

## 2018-06-15 ENCOUNTER — Ambulatory Visit (INDEPENDENT_AMBULATORY_CARE_PROVIDER_SITE_OTHER): Payer: Commercial Managed Care - PPO

## 2018-06-15 ENCOUNTER — Other Ambulatory Visit: Payer: Self-pay

## 2018-06-15 VITALS — BP 122/70 | HR 68 | Ht 63.5 in | Wt 210.4 lb

## 2018-06-15 DIAGNOSIS — R059 Cough, unspecified: Secondary | ICD-10-CM

## 2018-06-15 DIAGNOSIS — R05 Cough: Secondary | ICD-10-CM

## 2018-06-15 DIAGNOSIS — J449 Chronic obstructive pulmonary disease, unspecified: Secondary | ICD-10-CM | POA: Diagnosis not present

## 2018-06-15 DIAGNOSIS — F17200 Nicotine dependence, unspecified, uncomplicated: Secondary | ICD-10-CM

## 2018-06-15 MED ORDER — BENZONATATE 200 MG PO CAPS: 200.0000 mg | ORAL_CAPSULE | Freq: Three times a day (TID) | ORAL | 1 refills | Status: DC | PRN

## 2018-06-15 NOTE — Assessment & Plan Note (Signed)
Patient has started [name] Smoking cessation instruction/counseling given:  counseled patient on the dangers of tobacco use, advised patient to stop smoking, and reviewed strategies to maximize success

## 2018-06-15 NOTE — Progress Notes (Signed)
Reviewed, agree 

## 2018-06-15 NOTE — Assessment & Plan Note (Signed)
Patient returns for follow-up visit today.  She has recently completed a course of azithromycin and prednisone.  She still complains of a cough with tan sputum at visit today.  She denies any fever.  We will check a chest x-ray today to rule out pneumonia.  Patient Instructions  Will order chest x ray and call with results Will order tessalon perles for cough Continue trelegy Continue albuterol as needed Continue mucinex Continue chantix - please quit smoking as soon as possible.   Follow up with Dr. Kendrick Fries in 2 months or sooner if needed

## 2018-06-15 NOTE — Patient Instructions (Addendum)
Will order chest x ray and call with results Will order tessalon perles for cough Continue trelegy Continue albuterol as needed Continue mucinex Continue chantix - please quit smoking as soon as possible.   Follow up with Dr. Kendrick Fries in 2 months or sooner if needed

## 2018-06-15 NOTE — Progress Notes (Signed)
@Patient  ID: Meagan Lamb, female    DOB: 1950-07-01, 68 y.o.   MRN: [id number]  Chief Complaint  Patient presents with  . Cough    has upper pressure in her chest. Still smoking. Feels like there can be a burning sensation in her lungs when she coughs.    Referring provider: Noni Saupe, MD  HPI 68 year old female smoker with COPD who is followed by Dr. Kendrick Fries.  Tests: Chest imaging: August 2018 CT angiogram chest showed no pulmonary embolism, minimal atelectasis bases.  Images independently reviewed, there is really no significant emphysema to be seen. 02/2017 CXR : emphysema, images personally reviewed  PFT: May 2013 spirometry ratio 59%, FEV1 1.71 L 61% predicted 02/2017 ratio 67% FEV1 1.2L (45% pred)  Labs: 02/2017 IgE 133 IU/mL, 200 eos/dL  OV 8/65/78 - Follow up Patient presents today for follow-up visit.  She was last seen by Dr. Kendrick Fries on 05/30/2018 for flulike illness.  Her influenza swab came back negative at last visit.  She did complete a course of azithromycin and prednisone.  She has been compliant with Trelegy and uses albuterol as needed.  She has started taking her [name] but still has not quit smoking.  She does state that she is trying to quit.  She states that she has been doing well since last visit.  She denies any recent fever.  She does still complain of some cough with tan sputum. Denies f/c/s, n/v/d, hemoptysis, PND, leg swelling.     Allergies  Allergen Reactions  . Codeine Shortness Of Breath    Hives and difficulty interaction  . Adhesive [Tape] Other (See Comments)    Skin tears-may use paper tape.  . Aspirin     SENSITIVITY--PT CAN TAKE 81MG  AS LONG AS COATED!  Marland Kitchen Bee Venom     Rash/hives Possible shock  . Latex     sensitivity  . Morphine And Related     Burning rash   . Penicillins     Rash   . Sulfur     Rash     Immunization History  Administered Date(s) Administered  . Influenza Split 06/09/2011  .  Pneumococcal Conjugate-13 03/29/2008, 05/12/2011    Past Medical History:  Diagnosis Date  . Anxiety   . Arthritis   . Asthma   . Blood transfusion    at 84 months old  . Complication of anesthesia    "takes her a long time to come out of it"  . COPD (chronic obstructive pulmonary disease) (HCC)   . Depression   . GERD (gastroesophageal reflux disease)   . H/O hiatal hernia   . Headache(784.0)    reoccuring migranes  . Mood disorder (HCC)   . Ovarian failure   . Peripheral vascular disease (HCC)    vein stripping due to poor circulation  . Recurrent upper respiratory infection (URI)    bronchitis March or April  . Shortness of breath    at times per patient  . Smoker 08/09/2011   Followed in Pulmonary clinic/ [facility]/ [place]    . Stomach ulcer 10/05/2016  . Tobacco abuse     Tobacco History: Social History   Tobacco Use  Smoking Status Current Every Day Smoker  . Packs/day: 1.50  . Years: 35.00  . Pack years: 52.50  . Types: Cigarettes  Smokeless Tobacco Never Used   Ready to quit: Yes Counseling given: Yes   Outpatient Encounter Medications as of 06/15/2018  Medication Sig  . albuterol (  PROVENTIL HFA;VENTOLIN HFA) 108 (90 Base) MCG/ACT inhaler Inhale 2 puffs into the lungs every 6 (six) hours as needed for wheezing or shortness of breath.  Marland Kitchen atorvastatin (LIPITOR) 20 MG tablet Take 1 tablet by mouth daily.  Marland Kitchen BIOTIN 5000 PO Take 2 tablets by mouth daily.  . Calcium Carbonate-Vit D-Min (CALCIUM 1200 PO) Take by mouth.  . Cranberry-Vitamin C-Vitamin E 4200-20-3 MG-MG-UNIT CAPS Take 1 tablet by mouth daily.  . Cyanocobalamin (B-12) 2500 MCG TABS Take 1 tablet by mouth daily.  . fluticasone (FLONASE) 50 MCG/ACT nasal spray Place 2 sprays into both nostrils Daily.  . Fluticasone-Umeclidin-Vilant (TRELEGY ELLIPTA) 100-62.5-25 MCG/INH AEPB Inhale 1 puff into the lungs daily.  Marland Kitchen ibuprofen (ADVIL,MOTRIN) 200 MG tablet Take 200 mg by mouth 4 (four) times daily.   Marland Kitchen ipratropium-albuterol (DUONEB) 0.5-2.5 (3) MG/3ML SOLN Take 3 mLs by nebulization 3 (three) times daily.  . QUEtiapine (SEROQUEL) 50 MG tablet Take 1 tablet by mouth at bedtime as needed.  . varenicline ([name] PAK) 0.5 MG X 11 & 1 MG X 42 tablet Take one 0.5 mg tablet by mouth once daily for 3 days, then increase to one 0.5 mg tablet twice daily for 4 days, then increase to one 1 mg tablet twice daily.  . benzonatate (TESSALON) 200 MG capsule Take 1 capsule (200 mg total) by mouth 3 (three) times daily as needed for cough.  . [DISCONTINUED] azithromycin (ZITHROMAX) 250 MG tablet Take as directed. (Patient not taking: Reported on 06/15/2018)  . [DISCONTINUED] predniSONE (DELTASONE) 20 MG tablet Take 1 tablet (20 mg total) by mouth daily with breakfast. (Patient not taking: Reported on 06/15/2018)   No facility-administered encounter medications on file as of 06/15/2018.      Review of Systems  Review of Systems  Constitutional: Negative.  Negative for chills and fever.  HENT: Negative.   Respiratory: Positive for cough. Negative for shortness of breath and wheezing.   Cardiovascular: Negative.  Negative for chest pain, palpitations and leg swelling.  Gastrointestinal: Negative.   Allergic/Immunologic: Negative.   Neurological: Negative.   Psychiatric/Behavioral: Negative.        Physical Exam  BP 122/70 (BP Location: Left Arm, Patient Position: Sitting, Cuff Size: Normal)   Pulse 68   Ht 5' 3.5" (1.613 m)   Wt 210 lb 6.4 oz (95.4 kg)   SpO2 93%   BMI 36.69 kg/m   Wt Readings from Last 5 Encounters:  06/15/18 210 lb 6.4 oz (95.4 kg)  05/30/18 213 lb 8 oz (96.8 kg)  07/14/17 209 lb (94.8 kg)  03/17/17 210 lb (95.3 kg)  02/28/17 210 lb (95.3 kg)     Physical Exam Vitals signs and nursing note reviewed.  Constitutional:      General: She is not in acute distress.    Appearance: She is well-developed.  Cardiovascular:     Rate and Rhythm: Normal rate and regular rhythm.   Pulmonary:     Effort: Pulmonary effort is normal. No respiratory distress.     Breath sounds: Normal breath sounds. No wheezing or rhonchi.  Musculoskeletal:        General: No swelling.  Neurological:     Mental Status: She is alert and oriented to person, place, and time.       Assessment & Plan:   COPD (chronic obstructive pulmonary disease) Patient returns for follow-up visit today.  She has recently completed a course of azithromycin and prednisone.  She still complains of a cough with tan sputum at visit  today.  She denies any fever.  We will check a chest x-ray today to rule out pneumonia.  Patient Instructions  Will order chest x ray and call with results Will order tessalon perles for cough Continue trelegy Continue albuterol as needed Continue mucinex Continue chantix - please quit smoking as soon as possible.   Follow up with Dr. Kendrick FriesMcQuaid in 2 months or sooner if needed    Smoker Patient has started [name] Smoking cessation instruction/counseling given:  counseled patient on the dangers of tobacco use, advised patient to stop smoking, and reviewed strategies to maximize success      Ivonne Andrewonya S Zamarion Longest, NP 06/15/2018

## 2018-08-10 DIAGNOSIS — Z0000 Encounter for general adult medical examination without abnormal findings: Secondary | ICD-10-CM | POA: Diagnosis not present

## 2018-08-10 DIAGNOSIS — R5383 Other fatigue: Secondary | ICD-10-CM | POA: Diagnosis not present

## 2018-08-10 DIAGNOSIS — Z1331 Encounter for screening for depression: Secondary | ICD-10-CM | POA: Diagnosis not present

## 2018-08-10 DIAGNOSIS — Z9181 History of falling: Secondary | ICD-10-CM | POA: Diagnosis not present

## 2018-08-10 DIAGNOSIS — G4700 Insomnia, unspecified: Secondary | ICD-10-CM | POA: Diagnosis not present

## 2018-08-10 DIAGNOSIS — J449 Chronic obstructive pulmonary disease, unspecified: Secondary | ICD-10-CM | POA: Diagnosis not present

## 2018-08-10 DIAGNOSIS — Z6832 Body mass index (BMI) 32.0-32.9, adult: Secondary | ICD-10-CM | POA: Diagnosis not present

## 2018-08-10 DIAGNOSIS — Z79899 Other long term (current) drug therapy: Secondary | ICD-10-CM | POA: Diagnosis not present

## 2018-08-22 ENCOUNTER — Ambulatory Visit: Payer: Commercial Managed Care - PPO | Admitting: Pulmonary Disease

## 2018-12-05 ENCOUNTER — Other Ambulatory Visit: Payer: Self-pay | Admitting: Pulmonary Disease

## 2019-01-10 ENCOUNTER — Telehealth: Payer: Self-pay | Admitting: Pulmonary Disease

## 2019-01-10 NOTE — Telephone Encounter (Signed)
Called and spoke to patient, verified with patient what the last Rx that was sent in stated. Patient stated that was on the pharmacy paperwork but she only received half.  Patient stated she will follow up with pharmacy.  While patient was on the phone scheduled patient for office visit since she needs to establish with another provider. Nothing further needed at this time.

## 2019-01-16 ENCOUNTER — Other Ambulatory Visit: Payer: Self-pay

## 2019-01-16 MED ORDER — TRELEGY ELLIPTA 100-62.5-25 MCG/INH IN AEPB: 1.0000 | INHALATION_SPRAY | Freq: Every day | RESPIRATORY_TRACT | 1 refills | Status: DC

## 2019-02-15 ENCOUNTER — Other Ambulatory Visit: Payer: Self-pay

## 2019-02-15 ENCOUNTER — Encounter: Payer: Self-pay | Admitting: Critical Care Medicine

## 2019-02-15 ENCOUNTER — Ambulatory Visit (INDEPENDENT_AMBULATORY_CARE_PROVIDER_SITE_OTHER): Payer: Commercial Managed Care - PPO | Admitting: Critical Care Medicine

## 2019-02-15 VITALS — BP 136/86 | HR 78 | Temp 98.5°F | Ht 65.0 in | Wt 202.8 lb

## 2019-02-15 DIAGNOSIS — J309 Allergic rhinitis, unspecified: Secondary | ICD-10-CM

## 2019-02-15 DIAGNOSIS — Z720 Tobacco use: Secondary | ICD-10-CM

## 2019-02-15 DIAGNOSIS — J449 Chronic obstructive pulmonary disease, unspecified: Secondary | ICD-10-CM

## 2019-02-15 DIAGNOSIS — Z23 Encounter for immunization: Secondary | ICD-10-CM | POA: Diagnosis not present

## 2019-02-15 MED ORDER — PREDNISONE 10 MG PO TABS: 20.0000 mg | ORAL_TABLET | Freq: Every day | ORAL | 0 refills | Status: DC

## 2019-02-15 MED ORDER — DOXYCYCLINE HYCLATE 100 MG PO TABS: 100.0000 mg | ORAL_TABLET | Freq: Two times a day (BID) | ORAL | 0 refills | Status: DC

## 2019-02-15 MED ORDER — ALBUTEROL SULFATE HFA 108 (90 BASE) MCG/ACT IN AERS: 2.0000 | INHALATION_SPRAY | Freq: Four times a day (QID) | RESPIRATORY_TRACT | 11 refills | Status: DC | PRN

## 2019-02-15 NOTE — Patient Instructions (Addendum)
Thank you for visiting Dr. Carlis Abbott at [facility]. We recommend the following:   Meds ordered this encounter  Medications  . predniSONE (DELTASONE) 10 MG tablet    Sig: Take 2 tablets (20 mg total) by mouth daily with breakfast.    Dispense:  10 tablet    Refill:  0  . doxycycline (VIBRA-TABS) 100 MG tablet    Sig: Take 1 tablet (100 mg total) by mouth 2 (two) times daily.    Dispense:  10 tablet    Refill:  0  . albuterol (VENTOLIN HFA) 108 (90 Base) MCG/ACT inhaler    Sig: Inhale 2 puffs into the lungs every 6 (six) hours as needed for wheezing or shortness of breath.    Dispense:  6.7 g    Refill:  11    Return in about 6 months (around 08/15/2019).    Please do your part to reduce the spread of COVID-19.  It is very important that you stop smoking or vaping. This is the single most important thing that you can do to improve your lung health.   S = Set a quit date. T = Tell family, friends, and the people around you that you plan to quit. A = Anticipate or plan ahead for the tough times you'll face while quitting. R = Remove cigarettes and other tobacco products from your home, car, and work. T = Talk to [place] about getting help to quit.  If you need help, please reach out to our office or the smoking cessation resources available: [place] Smoking Cessation Class: [phone] 1-800-QUIT-NOW www.BeTobaccoFree.gov

## 2019-02-15 NOTE — Progress Notes (Signed)
Synopsis: Referred in 2013 for COPD by Noni Saupe, MD.  Previously patient of Dr. Kendrick Fries.  Subjective:   PATIENT ID: Meagan Lamb GENDER: female DOB: 01-12-1951, MRN: [id number]  No chief complaint on file.   Meagan Lamb is a 68 year old woman with a history of COPD who presents for follow-up.  Her symptoms have been worse for the last several weeks, with more wheezing, mildly productive cough, and shortness of breath from her baseline.  At baseline she does not need albuterol, but recently has been using it up to 1-2 times a day.  She has continued on her Trelegy, but was unfortunately off of it for 2 days last week due to to difficulties getting her prescription filled.  She attributes her recent worsening of symptoms to the weather change, but over the past 4 to 6 weeks they have been stable despite being worse than her baseline.  She denies fever, chills, sweats, significant weight loss.  She has had some chest heaviness that comes and goes that is not associated with neck, back, arm, jaw pain, nausea, diaphoresis, or other concerning symptoms.  She has had some nasal congestion, itchy watery eyes, and thinks that she may have year-round allergies.  She has been managing this with fluticasone nasal spray and Mucinex DM.  She has tried Claritin in the past but did not feel as though it was helping.  She suspects that her allergies may be part of why her symptoms recently have been less controlled.  She is continuing to smoke 1.5 packs/day.  At her heaviest she smoked 3.5 packs/day.  She has made attempts to quit in the past, and was doing the best with [name] where she was down to 5 cigarettes/day.  Due to stress she restarted smoking.  She lives with her husband and daughter, who both smoke, but would support her in quitting and would stop smoking inside the house.  She is interested in trying again soon, but does not feel that her stress is well enough controlled right now to make it  likely that she would be successful.  She has [name] at home from March that she has not yet started.  She has not yet had her flu shot, but is willing to do that today.  Of note she has had pet chickens for about 30 years.     Past Medical History:  Diagnosis Date   Anxiety    Arthritis    Asthma    Blood transfusion    at 52 months old   Complication of anesthesia    "takes her a long time to come out of it"   COPD (chronic obstructive pulmonary disease) (HCC)    Depression    GERD (gastroesophageal reflux disease)    H/O hiatal hernia    Headache(784.0)    reoccuring migranes   Mood disorder (HCC)    Ovarian failure    Peripheral vascular disease (HCC)    vein stripping due to poor circulation   Recurrent upper respiratory infection (URI)    bronchitis March or April   Shortness of breath    at times per patient   Smoker 08/09/2011   Followed in Pulmonary clinic/ [facility]/ [place]     Stomach ulcer 10/05/2016   Tobacco abuse      Family History  Problem Relation Age of Onset   Hypertension Mother    Diabetes Mother    Cancer Mother    Heart disease Father  Past Surgical History:  Procedure Laterality Date   ANTERIOR LAT LUMBAR FUSION  08/26/2011   Procedure: ANTERIOR LATERAL LUMBAR FUSION 1 LEVEL;  Surgeon: Venita Lick, MD;  Location: [facility] OR;  Service: Orthopedics;  Laterality: N/A;  XLIF WITH POSTERIOR SPINAL FUSION L3-4   APPENDECTOMY     CHOLECYSTECTOMY     GALLBLADDER SURGERY  2009   HERNIA REPAIR     umbilical   LUMBAR DISC SURGERY     LUMBAR FUSION  08/26/2011   L3  L4   TUBAL LIGATION  1986   VARICOSE VEIN SURGERY      Social History   Socioeconomic History   Marital status: Married    Spouse name: Not on file   Number of children: Not on file   Years of education: Not on file   Highest education level: Not on file  Occupational History   Not on file  Social Needs   Financial resource strain:  Not on file   Food insecurity    Worry: Not on file    Inability: Not on file   Transportation needs    Medical: Not on file    Non-medical: Not on file  Tobacco Use   Smoking status: Current Every Day Smoker    Packs/day: 1.50    Years: 35.00    Pack years: 52.50    Types: Cigarettes   Smokeless tobacco: Never Used  Substance and Sexual Activity   Alcohol use: No   Drug use: No   Sexual activity: Yes  Lifestyle   Physical activity    Days per week: Not on file    Minutes per session: Not on file   Stress: Not on file  Relationships   Social connections    Talks on phone: Not on file    Gets together: Not on file    Attends religious service: Not on file    Active member of club or organization: Not on file    Attends meetings of clubs or organizations: Not on file    Relationship status: Not on file   Intimate partner violence    Fear of current or ex partner: Not on file    Emotionally abused: Not on file    Physically abused: Not on file    Forced sexual activity: Not on file  Other Topics Concern   Not on file  Social History Narrative   Not on file     Allergies  Allergen Reactions   Codeine Shortness Of Breath    Hives and difficulty interaction   Adhesive [Tape] Other (See Comments)    Skin tears-may use paper tape.   Aspirin     SENSITIVITY--PT CAN TAKE 81MG  AS LONG AS COATED!   Bee Venom     Rash/hives Possible shock   Latex     sensitivity   Morphine And Related     Burning rash    Penicillins     Rash    Sulfur     Rash      Immunization History  Administered Date(s) Administered   Influenza Split 06/09/2011   Pneumococcal Conjugate-13 03/29/2008, 05/12/2011    Outpatient Medications Prior to Visit  Medication Sig Dispense Refill   albuterol (PROVENTIL HFA;VENTOLIN HFA) 108 (90 Base) MCG/ACT inhaler Inhale 2 puffs into the lungs every 6 (six) hours as needed for wheezing or shortness of breath.      atorvastatin (LIPITOR) 20 MG tablet Take 1 tablet by mouth daily.  3   benzonatate (  TESSALON) 200 MG capsule Take 1 capsule (200 mg total) by mouth 3 (three) times daily as needed for cough. 30 capsule 1   BIOTIN 5000 PO Take 2 tablets by mouth daily.     Calcium Carbonate-Vit D-Min (CALCIUM 1200 PO) Take by mouth.     Cranberry-Vitamin C-Vitamin E 4200-20-3 MG-MG-UNIT CAPS Take 1 tablet by mouth daily.     Cyanocobalamin (B-12) 2500 MCG TABS Take 1 tablet by mouth daily.     fluticasone (FLONASE) 50 MCG/ACT nasal spray Place 2 sprays into both nostrils Daily.     Fluticasone-Umeclidin-Vilant (TRELEGY ELLIPTA) 100-62.5-25 MCG/INH AEPB Inhale 1 puff into the lungs daily. 60 each 1   ibuprofen (ADVIL,MOTRIN) 200 MG tablet Take 200 mg by mouth 4 (four) times daily.     ipratropium-albuterol (DUONEB) 0.5-2.5 (3) MG/3ML SOLN Take 3 mLs by nebulization 3 (three) times daily.     QUEtiapine (SEROQUEL) 50 MG tablet Take 1 tablet by mouth at bedtime as needed.  3   varenicline ([name] PAK) 0.5 MG X 11 & 1 MG X 42 tablet Take one 0.5 mg tablet by mouth once daily for 3 days, then increase to one 0.5 mg tablet twice daily for 4 days, then increase to one 1 mg tablet twice daily. 53 tablet 0   No facility-administered medications prior to visit.     Review of Systems  Constitutional: Negative for chills, fever and weight loss.  HENT: Positive for congestion.   Eyes:       Watery and itchy eyes  Respiratory: Positive for cough, sputum production, shortness of breath and wheezing. Negative for hemoptysis.   Cardiovascular: Negative.   Gastrointestinal: Negative.   Musculoskeletal: Negative for joint pain and myalgias.  Skin: Negative for rash.  Neurological: Negative for dizziness and focal weakness.  Endo/Heme/Allergies: Positive for environmental allergies.     Objective:  There were no vitals filed for this visit.   on   RA BMI Readings from Last 3 Encounters:  06/15/18 36.69  kg/m  05/30/18 37.23 kg/m  07/14/17 32.73 kg/m   Wt Readings from Last 3 Encounters:  06/15/18 210 lb 6.4 oz (95.4 kg)  05/30/18 213 lb 8 oz (96.8 kg)  07/14/17 209 lb (94.8 kg)    Physical Exam Constitutional:      Appearance: Normal appearance. She is not ill-appearing or diaphoretic.  HENT:     Head: Normocephalic and atraumatic.     Nose:     Comments: Deferred due to masking requirement.    Mouth/Throat:     Comments: Deferred due to masking requirement. Eyes:     General: No scleral icterus. Neck:     Musculoskeletal: Neck supple.  Cardiovascular:     Rate and Rhythm: Normal rate and regular rhythm.     Heart sounds: No murmur.  Pulmonary:     Comments: Breathing comfortably on room air, no conversational dyspnea or tachypnea.  Prolonged expiratory phase, but no wheezing. Abdominal:     General: There is no distension.     Palpations: Abdomen is soft.     Tenderness: There is no abdominal tenderness.  Musculoskeletal:        General: No swelling or deformity.  Lymphadenopathy:     Cervical: No cervical adenopathy.  Skin:    General: Skin is warm and dry.     Findings: No rash.  Neurological:     General: No focal deficit present.     Mental Status: She is alert.     Motor:  No weakness.     Coordination: Coordination normal.  Psychiatric:        Mood and Affect: Mood normal.        Behavior: Behavior normal.      CBC    Component Value Date/Time   WBC 7.7 02/28/2017 1433   WBC 7.6 08/20/2011 0931   RBC 4.54 02/28/2017 1433   RBC 4.83 08/20/2011 0931   HGB 14.8 02/28/2017 1433   HCT 43.8 02/28/2017 1433   PLT 199 08/20/2011 0931   MCV 97 02/28/2017 1433   MCH 32.6 02/28/2017 1433   MCH 33.3 08/20/2011 0931   MCHC 33.8 02/28/2017 1433   MCHC 33.6 08/20/2011 0931   RDW 13.7 02/28/2017 1433   LYMPHSABS 3.0 02/28/2017 1433   MONOABS 0.5 08/20/2011 0931   EOSABS 0.2 02/28/2017 1433   BASOSABS 0.0 02/28/2017 1433    02/2017 IgE 133 IU/mL, 200  eos/dL  Chest Imaging- films reviewed: CXR, 2 view 06/15/2018-hyperinflated, kyphosis, increased lung markings bilaterally. No significant changes compared to December 2018.  Pulmonary Functions Testing Results: No flowsheet data found.  May 2013 spirometry ratio 59%, FEV1 1.71 L 61% predicted 02/2017 ratio 67% FEV1 1.2L (45% pred)      Assessment & Plan:     ICD-10-CM   1. Chronic obstructive pulmonary disease, unspecified COPD type (HCC)  J44.9 predniSONE (DELTASONE) 10 MG tablet    doxycycline (VIBRA-TABS) 100 MG tablet    albuterol (VENTOLIN HFA) 108 (90 Base) MCG/ACT inhaler  2. Tobacco abuse  Z72.0   3. Need for immunization against influenza  Z23 Flu Vaccine QUAD High Dose(Fluad)  4. Allergic rhinitis, unspecified seasonality, unspecified trigger  J30.9    COPD with acute exacerbation.  This is her second exacerbation in 2020. -Continue Trelegy once daily -Continue albuterol as needed -5 days of azithromycin and prednisone 20 mg -Recommend smoking cessation -Flu shot today.  Up-to-date on pneumonia vaccines.  Allergic rhinosinusitis -Recommend starting Claritin currently; with her exacerbation in March and now November around when the weather is turning, I suspect there may be a component of allergic rhinosinusitis exacerbating her.  If Claritin does not seem to be helping, she can also switch to Zyrtec. -Continue Flonase -Continue Mucinex as needed  Tobacco abuse -Spent an extensive amount of time discussing the importance of smoking cessation, and benefits of both quitting and cutting back.  She was congratulated on her previous successful attempts at cutting back on smoking.  She has [name] available, and would like to use it sometime in the future, but does not feel that she is ready right now.  I have invited her to please let us know what we can do to support her smoking cessation efforts.  It is great that her family would be supportive with trying to help her  quit. -We discussed the possibility of low-dose lung cancer screening CTs-the risks and benefits.  She is not sure yet if she would like to participate in this, and she will think about it.  RTC in 6 months.    Current Outpatient Medications:    albuterol (PROVENTIL HFA;VENTOLIN HFA) 108 (90 Base) MCG/ACT inhaler, Inhale 2 puffs into the lungs every 6 (six) hours as needed for wheezing or shortness of breath., Disp: , Rfl:    atorvastatin (LIPITOR) 20 MG tablet, Take 1 tablet by mouth daily., Disp: , Rfl: 3   benzonatate (TESSALON) 200 MG capsule, Take 1 capsule (200 mg total) by mouth 3 (three) times daily as needed for cough., Disp:  30 capsule, Rfl: 1   BIOTIN 5000 PO, Take 2 tablets by mouth daily., Disp: , Rfl:    Calcium Carbonate-Vit D-Min (CALCIUM 1200 PO), Take by mouth., Disp: , Rfl:    Cranberry-Vitamin C-Vitamin E 4200-20-3 MG-MG-UNIT CAPS, Take 1 tablet by mouth daily., Disp: , Rfl:    Cyanocobalamin (B-12) 2500 MCG TABS, Take 1 tablet by mouth daily., Disp: , Rfl:    fluticasone (FLONASE) 50 MCG/ACT nasal spray, Place 2 sprays into both nostrils Daily., Disp: , Rfl:    Fluticasone-Umeclidin-Vilant (TRELEGY ELLIPTA) 100-62.5-25 MCG/INH AEPB, Inhale 1 puff into the lungs daily., Disp: 60 each, Rfl: 1   ibuprofen (ADVIL,MOTRIN) 200 MG tablet, Take 200 mg by mouth 4 (four) times daily., Disp: , Rfl:    ipratropium-albuterol (DUONEB) 0.5-2.5 (3) MG/3ML SOLN, Take 3 mLs by nebulization 3 (three) times daily., Disp: , Rfl:    QUEtiapine (SEROQUEL) 50 MG tablet, Take 1 tablet by mouth at bedtime as needed., Disp: , Rfl: 3   varenicline ([name] PAK) 0.5 MG X 11 & 1 MG X 42 tablet, Take one 0.5 mg tablet by mouth once daily for 3 days, then increase to one 0.5 mg tablet twice daily for 4 days, then increase to one 1 mg tablet twice daily., Disp: 53 tablet, Rfl: 0   Steffanie Dunn, DO  Pulmonary Critical Care 02/15/2019 8:24 AM

## 2019-04-08 ENCOUNTER — Other Ambulatory Visit: Payer: Self-pay | Admitting: Critical Care Medicine

## 2019-06-07 ENCOUNTER — Other Ambulatory Visit: Payer: Self-pay | Admitting: Critical Care Medicine

## 2019-08-06 ENCOUNTER — Other Ambulatory Visit: Payer: Self-pay | Admitting: Critical Care Medicine

## 2019-08-10 ENCOUNTER — Telehealth: Payer: Self-pay | Admitting: Critical Care Medicine

## 2019-08-10 MED ORDER — TRELEGY ELLIPTA 100-62.5-25 MCG/INH IN AEPB: 1.0000 | INHALATION_SPRAY | Freq: Every day | RESPIRATORY_TRACT | 5 refills | Status: DC

## 2019-08-10 NOTE — Telephone Encounter (Signed)
rx has been sent to preferred pharmacy for pt. Called and spoke with pt letting her know this had been done and she verbalized understanding. Nothing further needed.

## 2019-08-22 ENCOUNTER — Other Ambulatory Visit: Payer: Self-pay | Admitting: *Deleted

## 2019-09-05 ENCOUNTER — Encounter: Payer: Self-pay | Admitting: Critical Care Medicine

## 2019-09-05 ENCOUNTER — Other Ambulatory Visit: Payer: Self-pay

## 2019-09-05 ENCOUNTER — Ambulatory Visit (INDEPENDENT_AMBULATORY_CARE_PROVIDER_SITE_OTHER): Payer: Commercial Managed Care - PPO | Admitting: Critical Care Medicine

## 2019-09-05 VITALS — BP 122/78 | HR 72 | Temp 98.1°F | Ht 66.0 in | Wt 204.0 lb

## 2019-09-05 DIAGNOSIS — R079 Chest pain, unspecified: Secondary | ICD-10-CM | POA: Diagnosis not present

## 2019-09-05 DIAGNOSIS — R0602 Shortness of breath: Secondary | ICD-10-CM | POA: Diagnosis not present

## 2019-09-05 DIAGNOSIS — Z720 Tobacco use: Secondary | ICD-10-CM

## 2019-09-05 MED ORDER — BREZTRI AEROSPHERE 160-9-4.8 MCG/ACT IN AERO: 2.0000 | INHALATION_SPRAY | Freq: Two times a day (BID) | RESPIRATORY_TRACT | 11 refills | Status: DC

## 2019-09-05 MED ORDER — IPRATROPIUM-ALBUTEROL 0.5-2.5 (3) MG/3ML IN SOLN: 3.0000 mL | Freq: Three times a day (TID) | RESPIRATORY_TRACT | 11 refills | Status: DC

## 2019-09-05 NOTE — Patient Instructions (Addendum)
Thank you for visiting Dr. Chestine Spore at [facility]. We recommend the following: Orders Placed This Encounter  Procedures  . Ambulatory Referral for Lung Cancer Scre  . Myocardial Perfusion Imaging  . ECHOCARDIOGRAM COMPLETE   Orders Placed This Encounter  Procedures  . Ambulatory Referral for Lung Cancer Scre    Referral Priority:   Routine    Referral Type:   Consultation    Referral Reason:   Specialty Services Required    Number of Visits Requested:   1  . Myocardial Perfusion Imaging    Standing Status:   Future    Standing Expiration Date:   09/04/2020    Order Specific Question:   ** REASON FOR EXAM (FREE TEXT)    Answer:   exertional left-sided CP, smoker    Order Specific Question:   Where should this be performed?    Answer:   [facility]    Order Specific Question:   Type of stress    Answer:   Eugenie Birks    Order Specific Question:   Patient weight in lbs    Answer:   204  . ECHOCARDIOGRAM COMPLETE    Standing Status:   Future    Standing Expiration Date:   09/04/2020    Order Specific Question:   Where should this test be performed    Answer:   [facility])    Order Specific Question:   Does the patient weigh less than or greater than 250 lbs?    Answer:   Patient weighs less than 250 lbs    Order Specific Question:   Perflutren DEFINITY (image enhancing agent) should be administered unless hypersensitivity or allergy exist    Answer:   Administer Perflutren    Order Specific Question:   Reason for exam-Echo    Answer:   Chest Pain  786.50 / R07.9    Meds ordered this encounter  Medications  . Budeson-Glycopyrrol-Formoterol (BREZTRI AEROSPHERE) 160-9-4.8 MCG/ACT AERO    Sig: Inhale 2 puffs into the lungs 2 (two) times daily.    Dispense:  10.7 g    Refill:  11  . ipratropium-albuterol (DUONEB) 0.5-2.5 (3) MG/3ML SOLN    Sig: Take 3 mLs by nebulization 3 (three) times daily.    Dispense:  360 mL    Refill:  11    Return in about 6 weeks (around 10/17/2019).    Please do your part to reduce the spread of COVID-19.

## 2019-09-05 NOTE — Progress Notes (Signed)
Synopsis: Referred in 2013 for COPD by Noni Saupe, MD.  Previously patient of Dr. Kendrick Fries.  Subjective:   PATIENT ID: Meagan Lamb GENDER: female DOB: 01/07/1951, MRN: [id number]  Chief Complaint  Patient presents with  . Follow-up    Patient has been having shortness of breath with exertion worse over the last month. Dry/productive cough with tan sputum.     Meagan Lamb is a 69 y/o woman with a history of tobacco abuse, cOPD, who presents for follow up. She continued to smoke 1- 1.5ppd. Her breathing is worse recently due to humidity. She has been getting out of breath faster; she was short of breath walking back from the waiting room. She gets occasional left-sided chest pain with exertion, better with rest. No dizziness.  She has had mild ankle edema at the end of the day in the last few weeks.  She has previously had negative stress testing in 2018, but has not followed up with cardiology since.  She continues to use Trelegy once daily.  She feels it is not working as well as it previously has been.  She ran out of DuoNebs.  At times she does not feel like her Trelegy is still working.  She has had allergic rhinosinusitis which she is treated with Claritin and Flonase.  She continues to smoke 1.5 packs/day.  She tried [name] few years ago, but has not tried it since November when it was prescribed again.  Today she wants to pursue lung cancer screening; previously she has been unsure.  She has not yet had her Covid vaccine and is not planning on it.  Concerns about long-term side effects of the vaccine.     OV 02/15/19: Meagan Lamb is a 69 year old woman with a history of COPD who presents for follow-up.  Her symptoms have been worse for the last several weeks, with more wheezing, mildly productive cough, and shortness of breath from her baseline.  At baseline she does not need albuterol, but recently has been using it up to 1-2 times a day.  She has continued on her Trelegy,  but was unfortunately off of it for 2 days last week due to to difficulties getting her prescription filled.  She attributes her recent worsening of symptoms to the weather change, but over the past 4 to 6 weeks they have been stable despite being worse than her baseline.  She denies fever, chills, sweats, significant weight loss.  She has had some chest heaviness that comes and goes that is not associated with neck, back, arm, jaw pain, nausea, diaphoresis, or other concerning symptoms.  She has had some nasal congestion, itchy watery eyes, and thinks that she may have year-round allergies.  She has been managing this with fluticasone nasal spray and Mucinex DM.  She has tried Claritin in the past but did not feel as though it was helping.  She suspects that her allergies may be part of why her symptoms recently have been less controlled.  She is continuing to smoke 1.5 packs/day.  At her heaviest she smoked 3.5 packs/day.  She has made attempts to quit in the past, and was doing the best with [name] where she was down to 5 cigarettes/day.  Due to stress she restarted smoking.  She lives with her husband and daughter, who both smoke, but would support her in quitting and would stop smoking inside the house.  She is interested in trying again soon, but does not feel that her stress  is well enough controlled right now to make it likely that she would be successful.  She has [name] at home from March that she has not yet started.  She has not yet had her flu shot, but is willing to do that today.  Of note she has had pet chickens for about 30 years.    Past Medical History:  Diagnosis Date  . Anxiety   . Arthritis   . Asthma   . Blood transfusion    at 186 months old  . Complication of anesthesia    "takes her a long time to come out of it"  . COPD (chronic obstructive pulmonary disease) (HCC)   . Depression   . GERD (gastroesophageal reflux disease)   . H/O hiatal hernia   . Headache(784.0)     reoccuring migranes  . Mood disorder (HCC)   . Ovarian failure   . Peripheral vascular disease (HCC)    vein stripping due to poor circulation  . Recurrent upper respiratory infection (URI)    bronchitis March or April  . Shortness of breath    at times per patient  . Smoker 08/09/2011   Followed in Pulmonary clinic/ [facility]/ [place]    . Stomach ulcer 10/05/2016  . Tobacco abuse      Family History  Problem Relation Age of Onset  . Hypertension Mother   . Diabetes Mother   . Cancer Mother   . Heart disease Father      Past Surgical History:  Procedure Laterality Date  . ANTERIOR LAT LUMBAR FUSION  08/26/2011   Procedure: ANTERIOR LATERAL LUMBAR FUSION 1 LEVEL;  Surgeon: Venita Lickahari Brooks, MD;  Location: [facility] OR;  Service: Orthopedics;  Laterality: N/A;  XLIF WITH POSTERIOR SPINAL FUSION L3-4  . APPENDECTOMY    . CHOLECYSTECTOMY    . GALLBLADDER SURGERY  2009  . HERNIA REPAIR     umbilical  . LUMBAR DISC SURGERY    . LUMBAR FUSION  08/26/2011   L3  L4  . TUBAL LIGATION  1986  . VARICOSE VEIN SURGERY      Social History   Socioeconomic History  . Marital status: Married    Spouse name: Not on file  . Number of children: Not on file  . Years of education: Not on file  . Highest education level: Not on file  Occupational History  . Not on file  Tobacco Use  . Smoking status: Current Every Day Smoker    Packs/day: 1.50    Years: 35.00    Pack years: 52.50    Types: Cigarettes  . Smokeless tobacco: Never Used  Substance and Sexual Activity  . Alcohol use: No  . Drug use: No  . Sexual activity: Yes  Other Topics Concern  . Not on file  Social History Narrative  . Not on file   Social Determinants of Health   Financial Resource Strain:   . Difficulty of Paying Living Expenses:   Food Insecurity:   . Worried About Programme researcher, broadcasting/film/videounning Out of Food in the Last Year:   . Baristaan Out of Food in the Last Year:   Transportation Needs:   . Freight forwarderLack of Transportation (Medical):   Marland Kitchen.  Lack of Transportation (Non-Medical):   Physical Activity:   . Days of Exercise per Week:   . Minutes of Exercise per Session:   Stress:   . Feeling of Stress :   Social Connections:   . Frequency of Communication with Friends and Family:   .  Frequency of Social Gatherings with Friends and Family:   . Attends Religious Services:   . Active Member of Clubs or Organizations:   . Attends Archivist Meetings:   Marland Kitchen Marital Status:   Intimate Partner Violence:   . Fear of Current or Ex-Partner:   . Emotionally Abused:   Marland Kitchen Physically Abused:   . Sexually Abused:      Allergies  Allergen Reactions  . Codeine Shortness Of Breath    Hives and difficulty interaction  . Adhesive [Tape] Other (See Comments)    Skin tears-may use paper tape.  . Aspirin     SENSITIVITY--PT CAN TAKE 81MG  AS LONG AS COATED!  Marland Kitchen Bee Venom     Rash/hives Possible shock  . Latex     sensitivity  . Morphine And Related     Burning rash   . Penicillins     Rash   . Sulfur     Rash      Immunization History  Administered Date(s) Administered  . Fluad Quad(high Dose 65+) 02/15/2019  . Influenza Split 06/09/2011  . Pneumococcal Conjugate-13 03/29/2008, 05/12/2011    Outpatient Medications Prior to Visit  Medication Sig Dispense Refill  . albuterol (VENTOLIN HFA) 108 (90 Base) MCG/ACT inhaler Inhale 2 puffs into the lungs every 6 (six) hours as needed for wheezing or shortness of breath. 6.7 g 11  . atorvastatin (LIPITOR) 20 MG tablet Take 1 tablet by mouth daily.  3  . Calcium Carbonate-Vit D-Min (CALCIUM 1200 PO) Take by mouth.    . Cranberry-Vitamin C-Vitamin E 4200-20-3 MG-MG-UNIT CAPS Take 1 tablet by mouth daily.    . Cyanocobalamin (B-12) 2500 MCG TABS Take 1 tablet by mouth daily.    . fluticasone (FLONASE) 50 MCG/ACT nasal spray Place 2 sprays into both nostrils Daily.    . Fluticasone-Umeclidin-Vilant (TRELEGY ELLIPTA) 100-62.5-25 MCG/INH AEPB Inhale 1 puff into the lungs daily. 60  each 5  . ibuprofen (ADVIL,MOTRIN) 200 MG tablet Take 200 mg by mouth 4 (four) times daily.    . QUEtiapine (SEROQUEL) 50 MG tablet Take 1 tablet by mouth at bedtime as needed.  3  . ipratropium-albuterol (DUONEB) 0.5-2.5 (3) MG/3ML SOLN Take 3 mLs by nebulization 3 (three) times daily.    Marland Kitchen BIOTIN 5000 PO Take 2 tablets by mouth daily.    . benzonatate (TESSALON) 200 MG capsule Take 1 capsule (200 mg total) by mouth 3 (three) times daily as needed for cough. 30 capsule 1  . doxycycline (VIBRA-TABS) 100 MG tablet Take 1 tablet (100 mg total) by mouth 2 (two) times daily. 10 tablet 0  . predniSONE (DELTASONE) 10 MG tablet Take 2 tablets (20 mg total) by mouth daily with breakfast. 10 tablet 0  . varenicline ([name] PAK) 0.5 MG X 11 & 1 MG X 42 tablet Take one 0.5 mg tablet by mouth once daily for 3 days, then increase to one 0.5 mg tablet twice daily for 4 days, then increase to one 1 mg tablet twice daily. 53 tablet 0   No facility-administered medications prior to visit.    Review of Systems  Constitutional: Negative for chills, fever and weight loss.  HENT: Positive for congestion.   Eyes:       Watery and itchy eyes  Respiratory: Positive for cough, sputum production, shortness of breath and wheezing. Negative for hemoptysis.   Cardiovascular: Negative.   Gastrointestinal: Negative.   Musculoskeletal: Negative for joint pain and myalgias.  Skin: Negative for rash.  Neurological:  Negative for dizziness and focal weakness.  Endo/Heme/Allergies: Positive for environmental allergies.     Objective:   Vitals:   09/05/19 1047  BP: 122/78  Pulse: 72  Temp: 98.1 F (36.7 C)  TempSrc: Oral  SpO2: 92%  Weight: 204 lb (92.5 kg)  Height: 5\' 6"  (1.676 m)   92% on   RA BMI Readings from Last 3 Encounters:  09/05/19 32.93 kg/m  02/15/19 33.75 kg/m  06/15/18 36.69 kg/m   Wt Readings from Last 3 Encounters:  09/05/19 204 lb (92.5 kg)  02/15/19 202 lb 12.8 oz (92 kg)  06/15/18  210 lb 6.4 oz (95.4 kg)    Physical Exam Vitals reviewed.  Constitutional:      General: She is not in acute distress.    Comments: Chronically ill-appearing  HENT:     Head: Normocephalic and atraumatic.  Eyes:     General: No scleral icterus. Cardiovascular:     Rate and Rhythm: Normal rate and regular rhythm.     Heart sounds: No murmur.  Pulmonary:     Comments: Breathing comfortably on room air, no conversational dyspnea.  Diffuse bilateral expiratory wheezing.  Occasional wet sounding cough. Abdominal:     General: There is no distension.     Palpations: Abdomen is soft.     Tenderness: There is no abdominal tenderness.  Musculoskeletal:        General: Swelling present. No deformity.     Cervical back: Neck supple.  Lymphadenopathy:     Cervical: No cervical adenopathy.  Skin:    General: Skin is warm and dry.     Findings: No rash.  Neurological:     General: No focal deficit present.     Mental Status: She is alert.     Coordination: Coordination normal.  Psychiatric:        Mood and Affect: Mood normal.        Behavior: Behavior normal.      CBC    Component Value Date/Time   WBC 7.7 02/28/2017 1433   WBC 7.6 08/20/2011 0931   RBC 4.54 02/28/2017 1433   RBC 4.83 08/20/2011 0931   HGB 14.8 02/28/2017 1433   HCT 43.8 02/28/2017 1433   PLT 199 08/20/2011 0931   MCV 97 02/28/2017 1433   MCH 32.6 02/28/2017 1433   MCH 33.3 08/20/2011 0931   MCHC 33.8 02/28/2017 1433   MCHC 33.6 08/20/2011 0931   RDW 13.7 02/28/2017 1433   LYMPHSABS 3.0 02/28/2017 1433   MONOABS 0.5 08/20/2011 0931   EOSABS 0.2 02/28/2017 1433   BASOSABS 0.0 02/28/2017 1433    02/2017 IgE 133 IU/mL, 200 eos/dL  Chest Imaging- films reviewed: CXR, 2 view 06/15/2018-hyperinflated, kyphosis, increased lung markings bilaterally. No significant changes compared to December 2018.  Pulmonary Functions Testing Results: No flowsheet data found.  May 2013 spirometry ratio 59%, FEV1 1.71  L 61% predicted 02/2017 ratio 67% FEV1 1.2L (45% pred)      Assessment & Plan:     ICD-10-CM   1. Chest pain, unspecified type  R07.9 Myocardial Perfusion Imaging    ECHOCARDIOGRAM COMPLETE  2. SOB (shortness of breath)  R06.02 Ambulatory Referral for Lung Cancer Scre    ipratropium-albuterol (DUONEB) 0.5-2.5 (3) MG/3ML SOLN    ECHOCARDIOGRAM COMPLETE  3. Tobacco abuse  Z72.0 Ambulatory Referral for Lung Cancer Scre    Myocardial Perfusion Imaging    ECHOCARDIOGRAM COMPLETE       COPD- GOLD D.  2 exacerbations in 2020. Progressive  symptoms. -Trial of Breztri 2 puffs twice daily if her insurance will cover this.  Stop Trelegy once starting Breztri. -Continue albuterol every 4 hours as needed -Recommend complete smoking cessation -Up-to-date on flu and pneumonia vaccines.  Recommend Covid vaccine, which she declines  Tobacco abuse -Referral to lung cancer screening program. -Discussed the risks of ongoing tobacco abuse and recommended complete cessation or cutting back.  One of her main barriers is concern for weight gain.  She failed [name] starter pack in November and is contemplating starting this.  I asked her to please let us know what other tools we can provide to be of assistance.  Chest pain, concern for anginal equivalent -Stress test; reports that her legs will but not be able to tolerate exercise stress testing. -Echocardiogram -Recommend complete smoking cessation  RTC in 2 months.    Current Outpatient Medications:  .  albuterol (VENTOLIN HFA) 108 (90 Base) MCG/ACT inhaler, Inhale 2 puffs into the lungs every 6 (six) hours as needed for wheezing or shortness of breath., Disp: 6.7 g, Rfl: 11 .  atorvastatin (LIPITOR) 20 MG tablet, Take 1 tablet by mouth daily., Disp: , Rfl: 3 .  Calcium Carbonate-Vit D-Min (CALCIUM 1200 PO), Take by mouth., Disp: , Rfl:  .  Cranberry-Vitamin C-Vitamin E 4200-20-3 MG-MG-UNIT CAPS, Take 1 tablet by mouth daily., Disp: , Rfl:  .   Cyanocobalamin (B-12) 2500 MCG TABS, Take 1 tablet by mouth daily., Disp: , Rfl:  .  fluticasone (FLONASE) 50 MCG/ACT nasal spray, Place 2 sprays into both nostrils Daily., Disp: , Rfl:  .  Fluticasone-Umeclidin-Vilant (TRELEGY ELLIPTA) 100-62.5-25 MCG/INH AEPB, Inhale 1 puff into the lungs daily., Disp: 60 each, Rfl: 5 .  ibuprofen (ADVIL,MOTRIN) 200 MG tablet, Take 200 mg by mouth 4 (four) times daily., Disp: , Rfl:  .  ipratropium-albuterol (DUONEB) 0.5-2.5 (3) MG/3ML SOLN, Take 3 mLs by nebulization 3 (three) times daily., Disp: 360 mL, Rfl: 11 .  QUEtiapine (SEROQUEL) 50 MG tablet, Take 1 tablet by mouth at bedtime as needed., Disp: , Rfl: 3 .  BIOTIN 5000 PO, Take 2 tablets by mouth daily., Disp: , Rfl:  .  Budeson-Glycopyrrol-Formoterol (BREZTRI AEROSPHERE) 160-9-4.8 MCG/ACT AERO, Inhale 2 puffs into the lungs 2 (two) times daily., Disp: 10.7 g, Rfl: 11   Steffanie Dunn, DO [facility] 09/05/2019 1:03 PM

## 2019-09-11 ENCOUNTER — Other Ambulatory Visit: Payer: Self-pay | Admitting: *Deleted

## 2019-09-11 DIAGNOSIS — F17210 Nicotine dependence, cigarettes, uncomplicated: Secondary | ICD-10-CM

## 2019-09-11 DIAGNOSIS — Z87891 Personal history of nicotine dependence: Secondary | ICD-10-CM

## 2019-09-12 DIAGNOSIS — Z79899 Other long term (current) drug therapy: Secondary | ICD-10-CM | POA: Diagnosis not present

## 2019-09-12 DIAGNOSIS — D510 Vitamin B12 deficiency anemia due to intrinsic factor deficiency: Secondary | ICD-10-CM | POA: Diagnosis not present

## 2019-09-12 DIAGNOSIS — M1990 Unspecified osteoarthritis, unspecified site: Secondary | ICD-10-CM | POA: Diagnosis not present

## 2019-09-12 DIAGNOSIS — Z0000 Encounter for general adult medical examination without abnormal findings: Secondary | ICD-10-CM | POA: Diagnosis not present

## 2019-09-12 DIAGNOSIS — R5383 Other fatigue: Secondary | ICD-10-CM | POA: Diagnosis not present

## 2019-09-12 DIAGNOSIS — D519 Vitamin B12 deficiency anemia, unspecified: Secondary | ICD-10-CM | POA: Diagnosis not present

## 2019-09-12 DIAGNOSIS — E7800 Pure hypercholesterolemia, unspecified: Secondary | ICD-10-CM | POA: Diagnosis not present

## 2019-09-28 ENCOUNTER — Ambulatory Visit (HOSPITAL_COMMUNITY): Payer: Commercial Managed Care - PPO | Attending: Cardiology

## 2019-09-28 ENCOUNTER — Telehealth (HOSPITAL_COMMUNITY): Payer: Self-pay | Admitting: *Deleted

## 2019-09-28 ENCOUNTER — Other Ambulatory Visit: Payer: Self-pay

## 2019-09-28 DIAGNOSIS — Z720 Tobacco use: Secondary | ICD-10-CM | POA: Diagnosis not present

## 2019-09-28 DIAGNOSIS — R0602 Shortness of breath: Secondary | ICD-10-CM | POA: Insufficient documentation

## 2019-09-28 DIAGNOSIS — R079 Chest pain, unspecified: Secondary | ICD-10-CM | POA: Insufficient documentation

## 2019-09-28 NOTE — Telephone Encounter (Signed)
Patient given detailed instructions per Myocardial Perfusion Study Information Sheet for the test on 10/02/19 at 10:30. Patient notified to arrive 15 minutes early and that it is imperative to arrive on time for appointment to keep from having the test rescheduled.  If you need to cancel or reschedule your appointment, please call the office within 24 hours of your appointment. . Patient verbalized understanding.Meagan Lamb

## 2019-10-02 ENCOUNTER — Other Ambulatory Visit: Payer: Self-pay

## 2019-10-02 ENCOUNTER — Ambulatory Visit (HOSPITAL_COMMUNITY): Payer: Commercial Managed Care - PPO | Attending: Cardiology

## 2019-10-02 DIAGNOSIS — Z720 Tobacco use: Secondary | ICD-10-CM | POA: Insufficient documentation

## 2019-10-02 DIAGNOSIS — R079 Chest pain, unspecified: Secondary | ICD-10-CM | POA: Diagnosis not present

## 2019-10-02 LAB — MYOCARDIAL PERFUSION IMAGING
LV dias vol: 57 mL (ref 46–106)
LV sys vol: 18 mL
Peak HR: 85 {beats}/min
Rest HR: 67 {beats}/min
SDS: 3
SRS: 1
SSS: 4
TID: 1.08

## 2019-10-02 MED ORDER — REGADENOSON 0.4 MG/5ML IV SOLN
0.4000 mg | Freq: Once | INTRAVENOUS | Status: AC
  Administered 2019-10-02: 0.4 mg via INTRAVENOUS

## 2019-10-02 MED ORDER — TECHNETIUM TC 99M TETROFOSMIN IV KIT
10.1000 | PACK | Freq: Once | INTRAVENOUS | Status: AC | PRN
  Administered 2019-10-02: 10.1 via INTRAVENOUS
  Filled 2019-10-02: qty 11

## 2019-10-02 MED ORDER — TECHNETIUM TC 99M TETROFOSMIN IV KIT
30.9000 | PACK | Freq: Once | INTRAVENOUS | Status: AC | PRN
  Administered 2019-10-02: 30.9 via INTRAVENOUS
  Filled 2019-10-02: qty 31

## 2019-10-02 NOTE — Progress Notes (Signed)
Please let Ms. Scheeler know that her stress test is low risk for having significant coronary disease.  Her heart function is normal.  Thanks!  LPC

## 2019-10-08 ENCOUNTER — Encounter: Payer: Self-pay | Admitting: Acute Care

## 2019-10-08 ENCOUNTER — Ambulatory Visit (INDEPENDENT_AMBULATORY_CARE_PROVIDER_SITE_OTHER): Payer: Commercial Managed Care - PPO | Admitting: Acute Care

## 2019-10-08 ENCOUNTER — Other Ambulatory Visit: Payer: Self-pay

## 2019-10-08 DIAGNOSIS — F17210 Nicotine dependence, cigarettes, uncomplicated: Secondary | ICD-10-CM

## 2019-10-08 DIAGNOSIS — Z122 Encounter for screening for malignant neoplasm of respiratory organs: Secondary | ICD-10-CM | POA: Insufficient documentation

## 2019-10-08 DIAGNOSIS — J439 Emphysema, unspecified: Secondary | ICD-10-CM | POA: Diagnosis not present

## 2019-10-08 DIAGNOSIS — I700 Atherosclerosis of aorta: Secondary | ICD-10-CM | POA: Diagnosis not present

## 2019-10-08 NOTE — Patient Instructions (Signed)

## 2019-10-08 NOTE — Progress Notes (Signed)
Shared Decision Making Visit Lung Cancer Screening Program [id number])   Eligibility:  Age 69 y.o.  Pack Years Smoking History Calculation 66 pack year smoking history (# packs/per year x # years smoked)  Recent History of coughing up blood  no  Unexplained weight loss? no ( >Than 15 pounds within the last 6 months )  Prior History Lung / other cancer no (Diagnosis within the last 5 years already requiring surveillance chest CT Scans).  Smoking Status Current Smoker  Former Smokers: Years since quit: NA  Quit Date: NA  Visit Components:  Discussion included one or more decision making aids. yes  Discussion included risk/benefits of screening. yes  Discussion included potential follow up diagnostic testing for abnormal scans. yes  Discussion included meaning and risk of over diagnosis. yes  Discussion included meaning and risk of False Positives. yes  Discussion included meaning of total radiation exposure. yes  Counseling Included:  Importance of adherence to annual lung cancer LDCT screening. yes  Impact of comorbidities on ability to participate in the program. yes  Ability and willingness to under diagnostic treatment. yes  Smoking Cessation Counseling:  Current Smokers:   Discussed importance of smoking cessation. yes  Information about tobacco cessation classes and interventions provided to patient. yes  Patient provided with "ticket" for LDCT Scan. yes  Symptomatic Patient. no  CounselingNA  Diagnosis Code: Tobacco Use Z72.0  Asymptomatic Patient yes  Counseling (Intermediate counseling: > three minutes counseling) [id number]  Former Smokers:   Discussed the importance of maintaining cigarette abstinence. yes  Diagnosis Code: Personal History of Nicotine Dependence. P38.250  Information about tobacco cessation classes and interventions provided to patient. Yes  Patient provided with "ticket" for LDCT Scan. yes  Written Order for Lung Cancer  Screening with LDCT placed in Epic. Yes (CT Chest Lung Cancer Screening Low Dose W/O CM) [id number] Z12.2-Screening of respiratory organs Z87.891-Personal history of nicotine dependence  I have spent 25 minutes of face to face time with Ms. Gangemi discussing the risks and benefits of lung cancer screening. We viewed a power point together that explained in detail the above noted topics. We paused at intervals to allow for questions to be asked and answered to ensure understanding.We discussed that the single most powerful action that she can take to decrease her risk of developing lung cancer is to quit smoking. We discussed whether or not she is ready to commit to setting a quit date. We discussed options for tools to aid in quitting smoking including nicotine replacement therapy, non-nicotine medications, support groups, Quit Smart classes, and behavior modification. We discussed that often times setting smaller, more achievable goals, such as eliminating 1 cigarette a day for a week and then 2 cigarettes a day for a week can be helpful in slowly decreasing the number of cigarettes smoked. This allows for a sense of accomplishment as well as providing a clinical benefit. I gave her the " Be Stronger Than Your Excuses" card with contact information for community resources, classes, free nicotine replacement therapy, and access to mobile apps, text messaging, and on-line smoking cessation help. I have also given her my card and contact information in the event she needs to contact me. We discussed the time and location of the scan, and that either Abigail Miyamoto RN or I will call with the results within 24-48 hours of receiving them. I have offered her  a copy of the power point we viewed  as a resource in the event they need reinforcement of  the concepts we discussed today in the office. The patient verbalized understanding of all of  the above and had no further questions upon leaving the office. They have my  contact information in the event they have any further questions.  I spent 3 minutes counseling on smoking cessation and the health risks of continued tobacco abuse.  I explained to the patient that there has been a high incidence of coronary artery disease noted on these exams. I explained that this is a non-gated exam therefore degree or severity cannot be determined. This patient is on statin therapy. I have asked the patient to follow-up with their PCP regarding any incidental finding of coronary artery disease and management with diet or medication as their PCP  feels is clinically indicated. The patient verbalized understanding of the above and had no further questions upon completion of the visit.   Dr. Chestine Spore discussed smoking cessation with the patient. She has started taking [name]. This is day 14. She states it is a slow process. She has cut down 2-3 cigarettes daily.   Bevelyn Ngo, NP 10/08/2019 10:51 AM

## 2019-10-18 ENCOUNTER — Telehealth: Payer: Self-pay | Admitting: Acute Care

## 2019-10-18 ENCOUNTER — Telehealth: Payer: Self-pay | Admitting: Critical Care Medicine

## 2019-10-18 DIAGNOSIS — Z87891 Personal history of nicotine dependence: Secondary | ICD-10-CM

## 2019-10-18 DIAGNOSIS — F17210 Nicotine dependence, cigarettes, uncomplicated: Secondary | ICD-10-CM

## 2019-10-18 NOTE — Telephone Encounter (Signed)
Called and spoke with patient about [name]. Patient states that she is on day 23 and the pharmacy won't refill it. Called pharmacy and asked them what was going on. Was told that [employer] issued a recall on [name] about a month ago and is not currently making any. She said no one has any, it can't be ordered and they have not received any updates on if they are going to start making it again or not.   Dr. Chestine Spore please advise if there is another option for the patient.

## 2019-10-18 NOTE — Telephone Encounter (Signed)
Please call patient and let them  know their  low dose Ct was read as a Lung RADS 2: nodules that are benign in appearance and behavior with a very low likelihood of becoming a clinically active cancer due to size or lack of growth. Recommendation per radiology is for a repeat LDCT in 12 months. .Please let them  know we will order and schedule their  annual screening scan for 09/2020. Please let them  know there was notation of CAD on their  scan.  Please remind the patient  that this is a non-gated exam therefore degree or severity of disease  cannot be determined. Please have them  follow up with their PCP regarding potential risk factor modification, dietary therapy or pharmacologic therapy if clinically indicated. Pt.  is  currently on statin therapy. Please place order for annual  screening scan for  09/2020 and fax results to PCP. Thanks so much. 

## 2019-10-18 NOTE — Telephone Encounter (Signed)
Please call and verify that she does not have a history of seizures in the past. I did not see it listed in her chart. If not, please prescribe Bupropion SR 150mg  BID (#60) with 3 refills. If she has significant side effects she should stop and let know. Thanks!  LPC

## 2019-10-19 MED ORDER — BUPROPION HCL ER (XL) 150 MG PO TB24
150.0000 mg | ORAL_TABLET | Freq: Two times a day (BID) | ORAL | 3 refills | Status: DC
Start: 2019-10-19 — End: 2022-10-14

## 2019-10-19 NOTE — Telephone Encounter (Signed)
Contacted patient, she does not have a history of seizures. Order for Bupropion sent per Dr. Ophelia Charter message to preferred pharmacy.

## 2019-10-22 NOTE — Telephone Encounter (Signed)
LMTC x 1  

## 2019-10-22 NOTE — Addendum Note (Signed)
Addended by: Abigail Miyamoto D on: 10/22/2019 10:16 AM   Modules accepted: Orders

## 2019-10-22 NOTE — Telephone Encounter (Signed)
Pt informed of CT results per Sarah Groce, NP.  PT verbalized understanding.  Copy sent to PCP.  Order placed for 1 yr f/u CT.  

## 2019-10-31 ENCOUNTER — Telehealth: Payer: Self-pay | Admitting: Critical Care Medicine

## 2019-10-31 NOTE — Telephone Encounter (Signed)
PA request received from [facility]  Drug requested: bupropion 150mg  CMM Key: [id number] Tried/failed: on file Covered alternatives:  PA request has been sent to plan, and a determination is expected within 3 days.   Routing to triage for follow-up. Please route to appropriate nurse/cma, I'm not sure who manages LC at this time.  Thanks!

## 2019-11-02 ENCOUNTER — Telehealth: Payer: Self-pay | Admitting: Critical Care Medicine

## 2019-11-02 NOTE — Telephone Encounter (Signed)
I think the error is that you prescribed XR. It should have been SR, which is a 2 time per day medication (vs XR which is only once per day).   Steffanie Dunn, DO 11/02/19 3:14 PM [facility]

## 2019-11-08 NOTE — Telephone Encounter (Signed)
PA was denied d/t [name] being the preferred alternative for smoking cessation.  I submitted an appeal via CMM with same key code stating that pt is not able to obtain [name] d/t recall.  Will await appeal results.

## 2019-11-22 DIAGNOSIS — F17200 Nicotine dependence, unspecified, uncomplicated: Secondary | ICD-10-CM | POA: Diagnosis not present

## 2019-11-22 DIAGNOSIS — J441 Chronic obstructive pulmonary disease with (acute) exacerbation: Secondary | ICD-10-CM | POA: Diagnosis not present

## 2020-01-27 IMAGING — DX CHEST - 2 VIEW
2 series · 2 of 2 positions shown · non-contrast
Comparison: 02/28/2017 and prior radiographs

CLINICAL DATA: Cough.

EXAM:
CHEST - 2 VIEW

[chest pa]
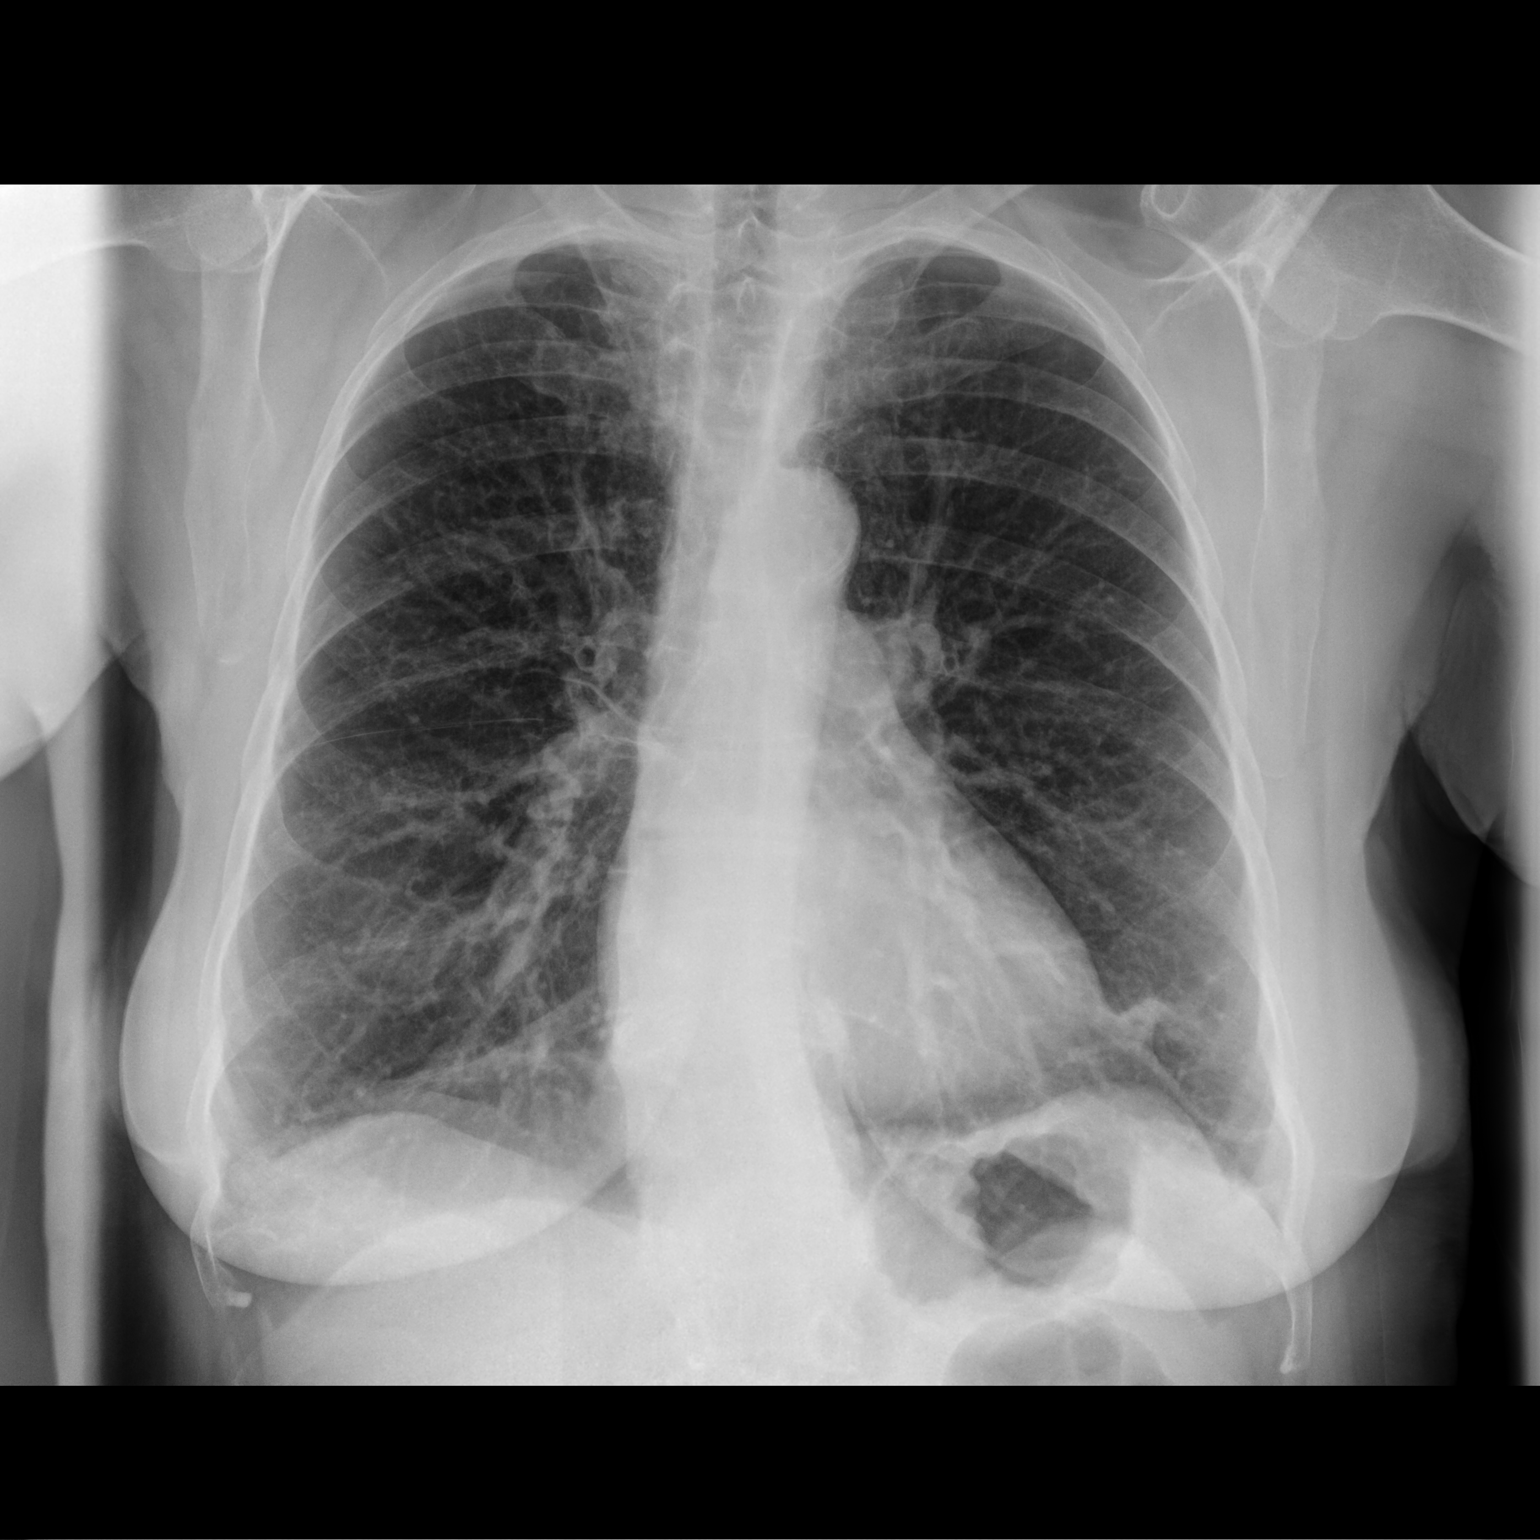

[chest lat]
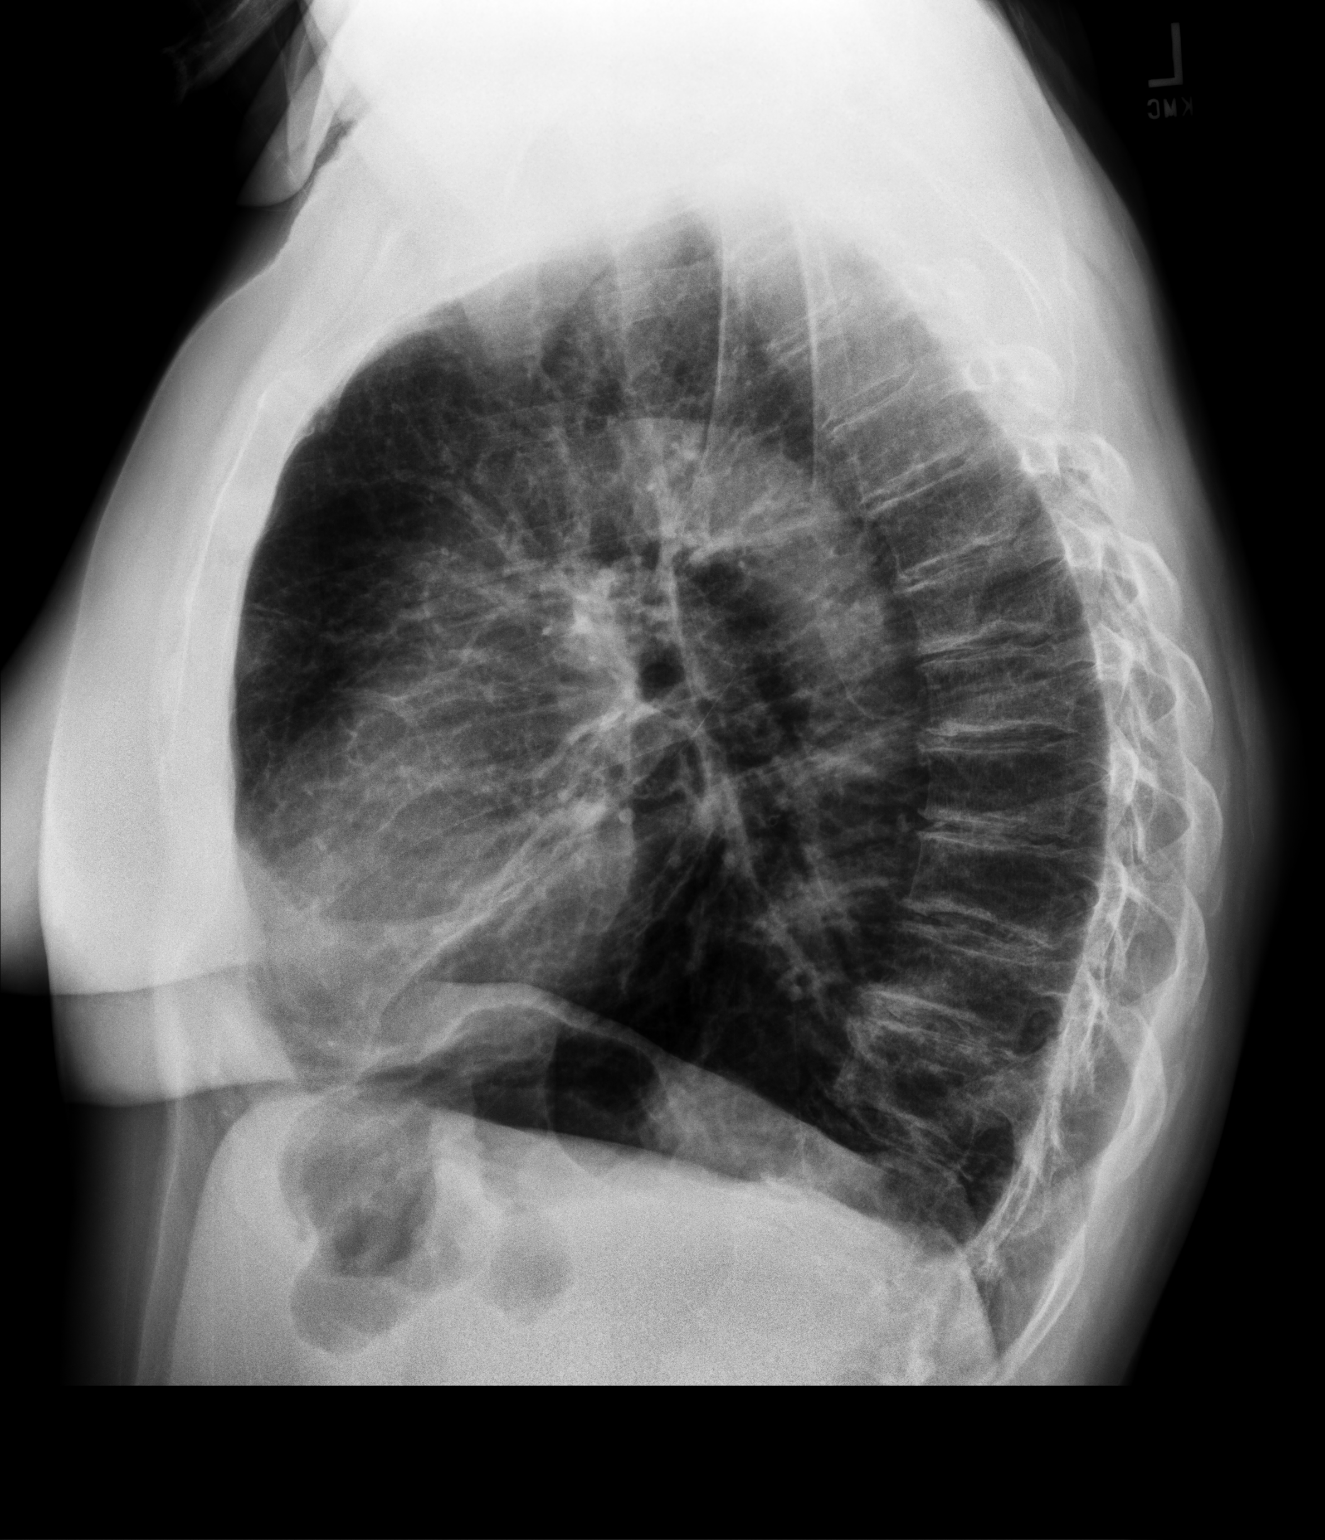

[2 of 2 positions shown; findings below may reference images not displayed]

FINDINGS: The cardiomediastinal silhouette is unremarkable.

Minimal bibasilar atelectasis/scarring again noted.

COPD type changes again noted.

There is no evidence of focal airspace disease, pulmonary edema,
suspicious pulmonary nodule/mass, pleural effusion, or pneumothorax.

No acute bony abnormalities are identified.
IMPRESSION: COPD without evidence of acute cardiopulmonary disease.

## 2020-04-10 ENCOUNTER — Other Ambulatory Visit: Payer: Self-pay | Admitting: Critical Care Medicine

## 2020-04-10 DIAGNOSIS — J449 Chronic obstructive pulmonary disease, unspecified: Secondary | ICD-10-CM

## 2020-05-13 ENCOUNTER — Other Ambulatory Visit: Payer: Self-pay | Admitting: Critical Care Medicine

## 2020-05-13 DIAGNOSIS — J449 Chronic obstructive pulmonary disease, unspecified: Secondary | ICD-10-CM

## 2020-09-17 ENCOUNTER — Other Ambulatory Visit: Payer: Self-pay | Admitting: Critical Care Medicine

## 2020-10-07 DIAGNOSIS — R600 Localized edema: Secondary | ICD-10-CM | POA: Diagnosis not present

## 2020-10-07 DIAGNOSIS — M6530 Trigger finger, unspecified finger: Secondary | ICD-10-CM | POA: Diagnosis not present

## 2020-10-07 DIAGNOSIS — M1990 Unspecified osteoarthritis, unspecified site: Secondary | ICD-10-CM | POA: Diagnosis not present

## 2020-10-07 DIAGNOSIS — Z13220 Encounter for screening for lipoid disorders: Secondary | ICD-10-CM | POA: Diagnosis not present

## 2020-10-07 DIAGNOSIS — Z0000 Encounter for general adult medical examination without abnormal findings: Secondary | ICD-10-CM | POA: Diagnosis not present

## 2020-10-07 DIAGNOSIS — K219 Gastro-esophageal reflux disease without esophagitis: Secondary | ICD-10-CM | POA: Diagnosis not present

## 2020-10-20 DIAGNOSIS — Z9181 History of falling: Secondary | ICD-10-CM | POA: Diagnosis not present

## 2020-10-20 DIAGNOSIS — Z6833 Body mass index (BMI) 33.0-33.9, adult: Secondary | ICD-10-CM | POA: Diagnosis not present

## 2020-10-20 DIAGNOSIS — E162 Hypoglycemia, unspecified: Secondary | ICD-10-CM | POA: Diagnosis not present

## 2020-10-20 DIAGNOSIS — Z1331 Encounter for screening for depression: Secondary | ICD-10-CM | POA: Diagnosis not present

## 2020-10-27 ENCOUNTER — Other Ambulatory Visit: Payer: Self-pay | Admitting: Acute Care

## 2020-11-19 ENCOUNTER — Other Ambulatory Visit: Payer: Self-pay

## 2020-11-19 ENCOUNTER — Ambulatory Visit (INDEPENDENT_AMBULATORY_CARE_PROVIDER_SITE_OTHER): Payer: Commercial Managed Care - PPO | Admitting: Acute Care

## 2020-11-19 ENCOUNTER — Encounter: Payer: Self-pay | Admitting: Acute Care

## 2020-11-19 VITALS — BP 122/78 | HR 73 | Temp 97.3°F | Ht 66.5 in | Wt 227.4 lb

## 2020-11-19 DIAGNOSIS — F17210 Nicotine dependence, cigarettes, uncomplicated: Secondary | ICD-10-CM | POA: Diagnosis not present

## 2020-11-19 DIAGNOSIS — J449 Chronic obstructive pulmonary disease, unspecified: Secondary | ICD-10-CM | POA: Diagnosis not present

## 2020-11-19 MED ORDER — BREZTRI AEROSPHERE 160-9-4.8 MCG/ACT IN AERO: 2.0000 | INHALATION_SPRAY | Freq: Two times a day (BID) | RESPIRATORY_TRACT | 0 refills | Status: DC

## 2020-11-19 MED ORDER — ALBUTEROL SULFATE HFA 108 (90 BASE) MCG/ACT IN AERS: 2.0000 | INHALATION_SPRAY | Freq: Four times a day (QID) | RESPIRATORY_TRACT | 2 refills | Status: DC | PRN

## 2020-11-19 MED ORDER — BREZTRI AEROSPHERE 160-9-4.8 MCG/ACT IN AERO: 2.0000 | INHALATION_SPRAY | Freq: Two times a day (BID) | RESPIRATORY_TRACT | 3 refills | Status: DC

## 2020-11-19 NOTE — Patient Instructions (Addendum)
It is good to see you today. Congratulations on  quitting smoking. We will order Pulmonary Function Tests We will refill your rescue inhaler today. Use your Duonebs once in the morning and once in the evening on days that the humidity is high.  Continue Breztri 1 puff once daily We will send in a refill for this.  Rinse mouth after use We will give you some samples today Use rescue inhaler as needed for shortness of breath  Note your daily symptoms > remember "red flags" for COPD:  Increase in cough, increase in sputum production, increase in shortness of breath or activity intolerance. If you notice these symptoms, please call to be seen.    See if you can find the equivalent of [facility] in [place].  Follow up after PFT's as a video visit in 1 month with Maralyn Sago NP Please contact office for sooner follow up if symptoms do not improve or worsen or seek emergency care

## 2020-11-19 NOTE — Progress Notes (Signed)
History of Present Illness Meagan Lamb is a 70 y.o. female former ( 44 pack year smoking history quit 07/2020)  with COPD. Previously Followed by Dr. Chestine Spore.  She is also [facility] by [facility].      11/19/2020 Pt. Presents for  annual follow up. She was last seen in the office 09/05/2019 by Dr. Chestine Spore. She quit smoking 08/23/2020 She has gained 27 pounds since she quit smoking.  She has witched from Trelegy to Windmill  at her last appointment with Dr. Chestine Spore. She likes this medication, and feels it helps maintain a good baseline, but she has been experiencing more dyspnea over the last month. She states her dyspnea is usually worse in the months when humidity is high. She is using her rescue inhaler 5-7 times a week. She denies  wheezing. She endorses scant Secretions .The secretions she does coughs up are thick.  She does have post nasal drip from allergies.  She is using Flonase daily and an over the counter allergy tablet.    Test Results: 02/2017 IgE 133 IU/mL, 200 eos/dL   Chest Imaging- films reviewed: CXR, 2 view 06/15/2018-hyperinflated, kyphosis, increased lung markings bilaterally. No significant changes compared to December 2018.   Pulmonary Functions Testing Results: No flowsheet data found.  May 2013 spirometry ratio 59%, FEV1 1.71 L 61% predicted 02/2017 ratio 67% FEV1 1.2L (45% pred)  CBC Latest Ref Rng & Units 02/28/2017 08/20/2011  WBC 3.4 - 10.8 x10E3/uL 7.7 7.6  Hemoglobin 11.1 - 15.9 g/dL 81.0 16.1(H)  Hematocrit 34.0 - 46.6 % 43.8 47.9(H)  Platelets 150 - 400 K/uL - 199    BMP Latest Ref Rng & Units 10/07/2016 08/20/2011  Glucose 65 - 99 mg/dL 85 175(Z)  BUN 8 - 27 mg/dL 24 12  Creatinine 0.25 - 1.00 mg/dL 8.52(D) 7.82  BUN/Creat Ratio 12 - 28 20 -  Sodium 134 - 144 mmol/L 142 141  Potassium 3.5 - 5.2 mmol/L 4.7 4.0  Chloride 96 - 106 mmol/L 102 101  CO2 20 - 29 mmol/L 26 29  Calcium 8.7 - 10.3 mg/dL 8.8 42.3    BNP    Component  Value Date/Time   BNP 48.5 10/07/2016 0000    ProBNP No results found for: PROBNP  PFT No results found for: FEV1PRE, FEV1POST, FVCPRE, FVCPOST, TLC, DLCOUNC, PREFEV1FVCRT, PSTFEV1FVCRT  No results found.   Past medical hx Past Medical History:  Diagnosis Date   Anxiety    Arthritis    Asthma    Blood transfusion    at 3 months old   Complication of anesthesia    "takes her a long time to come out of it"   COPD (chronic obstructive pulmonary disease) (HCC)    Depression    GERD (gastroesophageal reflux disease)    H/O hiatal hernia    Headache(784.0)    reoccuring migranes   Mood disorder (HCC)    Ovarian failure    Peripheral vascular disease (HCC)    vein stripping due to poor circulation   Recurrent upper respiratory infection (URI)    bronchitis March or April   Shortness of breath    at times per patient   Smoker 08/09/2011   Followed in Pulmonary clinic/ [facility]/ [place]     Stomach ulcer 10/05/2016   Tobacco abuse      Social History   Tobacco Use   Smoking status: Former    Packs/day: 1.50    Years: 44.00    Pack  years: 66.00    Types: Cigarettes    Quit date: 07/2020    Years since quitting: 0.3   Smokeless tobacco: Never   Tobacco comments:    Day 14 of [name]  Vaping Use   Vaping Use: Never used  Substance Use Topics   Alcohol use: No   Drug use: No    Ms.Bencomo reports that she quit smoking about 3 months ago. Her smoking use included cigarettes. She has a 66.00 pack-year smoking history. She has never used smokeless tobacco. She reports that she does not drink alcohol and does not use drugs.  Tobacco Cessation: Quit smoking 3 months ago, had a 66 pack year smoking history  Past surgical hx, Family hx, Social hx all reviewed.  Current Outpatient Medications on File Prior to Visit  Medication Sig   atorvastatin (LIPITOR) 20 MG tablet Take 1 tablet by mouth daily.   BIOTIN 5000 PO Take 2 tablets by mouth daily.   BREZTRI  AEROSPHERE 160-9-4.8 MCG/ACT AERO INHALE 2 PUFFS INTO THE LUNGS TWICE DAILY   buPROPion (WELLBUTRIN XL) 150 MG 24 hr tablet Take 1 tablet (150 mg total) by mouth in the morning and at bedtime.   Calcium Carbonate-Vit D-Min (CALCIUM 1200 PO) Take by mouth.   Cranberry-Vitamin C-Vitamin E 4200-20-3 MG-MG-UNIT CAPS Take 1 tablet by mouth daily.   Cyanocobalamin (B-12) 2500 MCG TABS Take 1 tablet by mouth daily.   fluticasone (FLONASE) 50 MCG/ACT nasal spray Place 2 sprays into both nostrils Daily.   ibuprofen (ADVIL,MOTRIN) 200 MG tablet Take 200 mg by mouth 4 (four) times daily.   ipratropium-albuterol (DUONEB) 0.5-2.5 (3) MG/3ML SOLN Take 3 mLs by nebulization 3 (three) times daily.   QUEtiapine (SEROQUEL) 50 MG tablet Take 1 tablet by mouth at bedtime as needed.   No current facility-administered medications on file prior to visit.     Allergies  Allergen Reactions   Codeine Shortness Of Breath    Hives and difficulty interaction   Adhesive [Tape] Other (See Comments)    Skin tears-may use paper tape.   Aspirin     SENSITIVITY--PT CAN TAKE 81MG  AS LONG AS COATED!   Bee Venom     Rash/hives Possible shock   Elemental Sulfur     Rash    Latex     sensitivity   Morphine And Related     Burning rash    Penicillins     Rash     Review Of Systems:  Constitutional:   No  weight loss, night sweats,  Fevers, chills, fatigue, or  lassitude.  HEENT:   No headaches,  Difficulty swallowing,  Tooth/dental problems, or  Sore throat,                No sneezing, itching, ear ache, nasal congestion, post nasal drip,   CV:  No chest pain,  Orthopnea, PND, swelling in lower extremities, anasarca, dizziness, palpitations, syncope.   GI  No heartburn, indigestion, abdominal pain, nausea, vomiting, diarrhea, change in bowel habits, loss of appetite, bloody stools.   Resp: +shortness of breath with exertion or at rest.  Minimal  excess mucus, + productive cough,  No non-productive cough,  No  coughing up of blood.  No change in color of mucus.  No wheezing.  No chest wall deformity  Skin: no rash or lesions.  GU: no dysuria, change in color of urine, no urgency or frequency.  No flank pain, no hematuria   MS:  No joint pain or swelling.  No decreased range of motion.  No back pain.  Psych:  No change in mood or affect. No depression or anxiety.  No memory loss.   Vital Signs BP 122/78 (BP Location: Right Arm, Cuff Size: Normal)   Pulse 73   Temp (!) 97.3 F (36.3 C) (Oral)   Ht 5' 6.5" (1.689 m)   Wt 227 lb 6.4 oz (103.1 kg)   SpO2 95%   BMI 36.15 kg/m    Physical Exam:  General- No distress,  A&Ox3, pleasant ENT: No sinus tenderness, TM clear, pale nasal mucosa, no oral exudate,no post nasal drip, no LAN Cardiac: S1, S2, regular rate and rhythm, no murmur Chest: No wheeze/ rales/ dullness; no accessory muscle use, no nasal flaring, no sternal retractions, diminished per bases Abd.: Soft Non-tender, ND, BS +, Body mass index is 36.15 kg/m. Ext: No clubbing cyanosis, edema Neuro:  normal strength, MAE x 4, A&O x 3, appropriate Skin: No rashes, warm and dry, no lesions Psych: normal mood and behavior   Assessment/Plan  COPD Flare Plan Congratulations on  quitting smoking. We will order Pulmonary Function Tests We will refill your rescue inhaler today. Use your Duonebs once in the morning and once in the evening on days that the humidity is high.  Continue Breztri 1 puff once daily We will send in a refill for this.  Rinse mouth after use We will give you some samples today Use rescue inhaler as needed for shortness of breath  Note your daily symptoms > remember "red flags" for COPD:  Increase in cough, increase in sputum production, increase in shortness of breath or activity intolerance. If you notice these symptoms, please call to be seen.    See if you can find the equivalent of [facility] in [place].  Follow up after PFT's as a video  visit in 1 month with Maralyn Sago NP Please contact office for sooner follow up if symptoms do not improve or worsen or seek emergency care     I spent 35 minutes dedicated to the care of this patient on the date of this encounter to include pre-visit review of records, face-to-face time with the patient discussing conditions above, post visit ordering of testing, clinical documentation with the electronic health record, making appropriate referrals as documented, and communicating necessary information to the patient's healthcare team.    Bevelyn Ngo, NP 11/19/2020  4:46 PM

## 2020-12-04 DIAGNOSIS — Z87891 Personal history of nicotine dependence: Secondary | ICD-10-CM | POA: Diagnosis not present

## 2020-12-04 DIAGNOSIS — Z122 Encounter for screening for malignant neoplasm of respiratory organs: Secondary | ICD-10-CM | POA: Diagnosis not present

## 2020-12-16 ENCOUNTER — Ambulatory Visit (INDEPENDENT_AMBULATORY_CARE_PROVIDER_SITE_OTHER): Payer: Commercial Managed Care - PPO | Admitting: Critical Care Medicine

## 2020-12-16 ENCOUNTER — Other Ambulatory Visit: Payer: Self-pay

## 2020-12-16 DIAGNOSIS — J449 Chronic obstructive pulmonary disease, unspecified: Secondary | ICD-10-CM | POA: Diagnosis not present

## 2020-12-16 LAB — PULMONARY FUNCTION TEST
DL/VA % pred: 89 %
DL/VA: 3.68 ml/min/mmHg/L
DLCO cor % pred: 66 %
DLCO cor: 13.37 ml/min/mmHg
DLCO unc % pred: 66 %
DLCO unc: 13.37 ml/min/mmHg
FEF 25-75 Post: 0.87 L/sec
FEF 25-75 Pre: 0.38 L/sec
FEF2575-%Change-Post: 125 %
FEF2575-%Pred-Post: 43 %
FEF2575-%Pred-Pre: 19 %
FEV1-%Change-Post: 28 %
FEV1-%Pred-Post: 51 %
FEV1-%Pred-Pre: 40 %
FEV1-Post: 1.24 L
FEV1-Pre: 0.97 L
FEV1FVC-%Change-Post: 1 %
FEV1FVC-%Pred-Pre: 75 %
FEV6-%Change-Post: 28 %
FEV6-%Pred-Post: 69 %
FEV6-%Pred-Pre: 53 %
FEV6-Post: 2.1 L
FEV6-Pre: 1.63 L
FEV6FVC-%Change-Post: 1 %
FEV6FVC-%Pred-Post: 102 %
FEV6FVC-%Pred-Pre: 100 %
FVC-%Change-Post: 26 %
FVC-%Pred-Post: 67 %
FVC-%Pred-Pre: 53 %
FVC-Post: 2.14 L
FVC-Pre: 1.69 L
Post FEV1/FVC ratio: 58 %
Post FEV6/FVC ratio: 98 %
Pre FEV1/FVC ratio: 57 %
Pre FEV6/FVC Ratio: 96 %
RV % pred: 193 %
RV: 4.3 L
TLC % pred: 122 %
TLC: 6.38 L

## 2020-12-16 NOTE — Progress Notes (Signed)
PFT done today. 

## 2020-12-22 ENCOUNTER — Encounter: Payer: Self-pay | Admitting: Acute Care

## 2020-12-22 ENCOUNTER — Other Ambulatory Visit: Payer: Self-pay

## 2020-12-22 ENCOUNTER — Telehealth (INDEPENDENT_AMBULATORY_CARE_PROVIDER_SITE_OTHER): Payer: Commercial Managed Care - PPO | Admitting: Acute Care

## 2020-12-22 DIAGNOSIS — J441 Chronic obstructive pulmonary disease with (acute) exacerbation: Secondary | ICD-10-CM | POA: Diagnosis not present

## 2020-12-22 MED ORDER — BREZTRI AEROSPHERE 160-9-4.8 MCG/ACT IN AERO: 2.0000 | INHALATION_SPRAY | Freq: Two times a day (BID) | RESPIRATORY_TRACT | 3 refills | Status: DC

## 2020-12-22 NOTE — Progress Notes (Addendum)
Virtual Visit via Video Note  I connected with Meagan Lamb on 12/22/20 at  9:00 AM EDT by a video enabled telemedicine application and verified that I am speaking with the correct person using two identifiers.  Location: Patient: At home Provider: [address], [place], [place], [address]    I discussed the limitations of evaluation and management by telemedicine and the availability of in person appointments. The patient expressed understanding and agreed to proceed.  Synopsis: 70 year old female former ( 44 pack year smoking history quit 07/2020)  with COPD. Previously Followed by Dr. Chestine Lamb. Needs new Primary Pulmonologist to be assigned   History of Present Illness: Pt. Presents for follow up. She was seen 11/19/2020 with a COPD Flare. We started Duonebs on schedule and she has improved. She feels she has returned to baseline. She is compliant with her Meagan Lamb daily. She rarely has to use her rescue inhaler since starting the Duo Nebs Still has occasional wheezing, but has only been having to use rescue  2-3 times the last few weeks.  She continues to use Flonase daily and over the counter allergy tablet. Secretions are still thick, but scant . I have encouraged her to use Mucinex to help thin them out and mobilize them .   PFT's were done to establish COPD Stage. >> Severe with excellent BD response   Observations/Objective:            Assessment and Plan: COPD Flare >> resolved after adding Duo Nebs BID Plan Keep up the great work on quitting smoking Your PFT's show severe COPD with excellent response to your inhalers and nebs Use rescue inhaler as needed for breakthrough shortness of breath Continue to Use your Duonebs once in the morning and once in the evening on days that the humidity is high.  Continue Breztri 1 puff once daily Rinse mouth after use Note your daily symptoms > remember "red flags" for COPD:  Increase in cough, increase in sputum  production, increase in shortness of breath or activity intolerance. If you notice these symptoms, please call to be seen.    Work on weight loss Follow up in 2 months with Meagan Sago NP or Meagan Lamb Please contact office for sooner follow up if symptoms do not improve or worsen or seek emergency care   Follow Up Instructions: Follow up in 2 months with Meagan Lamb   I discussed the assessment and treatment plan with the patient. The patient was provided an opportunity to ask questions and all were answered. The patient agreed with the plan and demonstrated an understanding of the instructions.   The patient was advised to call back or seek an in-person evaluation if the symptoms worsen or if the condition fails to improve as anticipated.  I spent 30 minutes dedicated to the care of this patient on the date of this encounter to include pre-visit review of records, non face-to-face time with the patient discussing conditions above, post visit ordering of testing, clinical documentation with the electronic health record, making appropriate referrals as documented, and communicating necessary information to the patient's healthcare team.   I provided 30 minutes minutes of non-face-to-face time during this encounter.   Meagan Ngo, NP

## 2020-12-22 NOTE — Patient Instructions (Signed)
COPD Flare >> resolved Plan Keep up the great work on quitting smoking Your PFT's show severe COPD with excellent response to your inhalers and nebs Use rescue inhaler as needed for breakthrough shortness of breath Continue to Use your Duonebs once in the morning and once in the evening on days that the humidity is high.  Continue Breztri 1 puff once daily We will send in a refill for this.  Rinse mouth after use Note your daily symptoms > remember "red flags" for COPD:  Increase in cough, increase in sputum production, increase in shortness of breath or activity intolerance. If you notice these symptoms, please call to be seen.    Work on weight loss Follow up in 2 months with Maralyn Sago NP or Dr. Francine Graven Please contact office for sooner follow up if symptoms do not improve or worsen or seek emergency care

## 2020-12-25 ENCOUNTER — Encounter: Payer: Self-pay | Admitting: Acute Care

## 2021-01-08 ENCOUNTER — Telehealth: Payer: Self-pay | Admitting: Acute Care

## 2021-01-08 DIAGNOSIS — Z87891 Personal history of nicotine dependence: Secondary | ICD-10-CM

## 2021-01-08 NOTE — Telephone Encounter (Signed)
    Results of low Dose CT Chest. See Telephone note 01/08/2021.

## 2021-01-08 NOTE — Telephone Encounter (Signed)
I have called the patient with the results of her scan. I explained that her scan was read as a Lung  RADS 3, nodules that are probably benign findings, short term follow up suggested: includes nodules with a low likelihood of becoming a clinically active cancer. Radiology recommends a 6 month repeat LDCT follow up. There was notation that this may be inflammatory or infectious, but patient denies being sick. She is in agreement with a 6 month follow up scan. ( March 2023) I told her we would be in touch closer to the time to get this scheduled. She verbalized understanding of the above and had no further questions.  Angelique Blonder, please fax results to PCP and place order for 6 month follow up scan. Thanks so much.

## 2021-01-08 NOTE — Addendum Note (Signed)
Addended by: Abigail Miyamoto D on: 01/08/2021 01:31 PM   Modules accepted: Orders

## 2021-01-08 NOTE — Telephone Encounter (Signed)
Order placed for 6 mth f/u nodule f/u CT. CT results faxed to PCP.

## 2021-02-04 ENCOUNTER — Other Ambulatory Visit: Payer: Self-pay | Admitting: Acute Care

## 2021-02-04 DIAGNOSIS — J449 Chronic obstructive pulmonary disease, unspecified: Secondary | ICD-10-CM

## 2021-04-27 DIAGNOSIS — M1990 Unspecified osteoarthritis, unspecified site: Secondary | ICD-10-CM | POA: Diagnosis not present

## 2021-04-27 DIAGNOSIS — G4700 Insomnia, unspecified: Secondary | ICD-10-CM | POA: Diagnosis not present

## 2021-04-27 DIAGNOSIS — Z6838 Body mass index (BMI) 38.0-38.9, adult: Secondary | ICD-10-CM | POA: Diagnosis not present

## 2021-06-08 DIAGNOSIS — R600 Localized edema: Secondary | ICD-10-CM | POA: Diagnosis not present

## 2021-06-08 DIAGNOSIS — M79672 Pain in left foot: Secondary | ICD-10-CM | POA: Diagnosis not present

## 2021-06-08 DIAGNOSIS — Z6838 Body mass index (BMI) 38.0-38.9, adult: Secondary | ICD-10-CM | POA: Diagnosis not present

## 2021-06-08 DIAGNOSIS — I739 Peripheral vascular disease, unspecified: Secondary | ICD-10-CM | POA: Diagnosis not present

## 2021-06-09 DIAGNOSIS — Z20822 Contact with and (suspected) exposure to covid-19: Secondary | ICD-10-CM | POA: Diagnosis not present

## 2021-07-17 DIAGNOSIS — Z6838 Body mass index (BMI) 38.0-38.9, adult: Secondary | ICD-10-CM | POA: Diagnosis not present

## 2021-07-17 DIAGNOSIS — E7800 Pure hypercholesterolemia, unspecified: Secondary | ICD-10-CM | POA: Diagnosis not present

## 2021-07-17 DIAGNOSIS — R600 Localized edema: Secondary | ICD-10-CM | POA: Diagnosis not present

## 2021-07-17 DIAGNOSIS — R0609 Other forms of dyspnea: Secondary | ICD-10-CM | POA: Diagnosis not present

## 2021-08-14 DIAGNOSIS — Z87891 Personal history of nicotine dependence: Secondary | ICD-10-CM | POA: Diagnosis not present

## 2021-08-14 DIAGNOSIS — R600 Localized edema: Secondary | ICD-10-CM | POA: Diagnosis not present

## 2021-08-14 DIAGNOSIS — G4733 Obstructive sleep apnea (adult) (pediatric): Secondary | ICD-10-CM | POA: Diagnosis not present

## 2021-08-14 DIAGNOSIS — J449 Chronic obstructive pulmonary disease, unspecified: Secondary | ICD-10-CM | POA: Diagnosis not present

## 2021-08-18 DIAGNOSIS — R0600 Dyspnea, unspecified: Secondary | ICD-10-CM | POA: Diagnosis not present

## 2021-09-18 DIAGNOSIS — J449 Chronic obstructive pulmonary disease, unspecified: Secondary | ICD-10-CM | POA: Diagnosis not present

## 2021-09-18 DIAGNOSIS — Z87891 Personal history of nicotine dependence: Secondary | ICD-10-CM | POA: Diagnosis not present

## 2021-09-18 DIAGNOSIS — R600 Localized edema: Secondary | ICD-10-CM | POA: Diagnosis not present

## 2021-09-18 DIAGNOSIS — G4733 Obstructive sleep apnea (adult) (pediatric): Secondary | ICD-10-CM | POA: Diagnosis not present

## 2021-10-16 DIAGNOSIS — G4733 Obstructive sleep apnea (adult) (pediatric): Secondary | ICD-10-CM | POA: Diagnosis not present

## 2021-10-16 DIAGNOSIS — J449 Chronic obstructive pulmonary disease, unspecified: Secondary | ICD-10-CM | POA: Diagnosis not present

## 2021-10-16 DIAGNOSIS — J019 Acute sinusitis, unspecified: Secondary | ICD-10-CM | POA: Diagnosis not present

## 2021-10-16 DIAGNOSIS — Z87891 Personal history of nicotine dependence: Secondary | ICD-10-CM | POA: Diagnosis not present

## 2021-10-30 DIAGNOSIS — R600 Localized edema: Secondary | ICD-10-CM | POA: Diagnosis not present

## 2021-10-30 DIAGNOSIS — J449 Chronic obstructive pulmonary disease, unspecified: Secondary | ICD-10-CM | POA: Diagnosis not present

## 2021-10-30 DIAGNOSIS — G4733 Obstructive sleep apnea (adult) (pediatric): Secondary | ICD-10-CM | POA: Diagnosis not present

## 2021-10-30 DIAGNOSIS — Z87891 Personal history of nicotine dependence: Secondary | ICD-10-CM | POA: Diagnosis not present

## 2021-11-26 DIAGNOSIS — M1990 Unspecified osteoarthritis, unspecified site: Secondary | ICD-10-CM | POA: Diagnosis not present

## 2021-11-26 DIAGNOSIS — Z6838 Body mass index (BMI) 38.0-38.9, adult: Secondary | ICD-10-CM | POA: Diagnosis not present

## 2021-11-26 DIAGNOSIS — Z0000 Encounter for general adult medical examination without abnormal findings: Secondary | ICD-10-CM | POA: Diagnosis not present

## 2021-11-26 DIAGNOSIS — M79651 Pain in right thigh: Secondary | ICD-10-CM | POA: Diagnosis not present

## 2021-11-26 DIAGNOSIS — Z91199 Patient's noncompliance with other medical treatment and regimen due to unspecified reason: Secondary | ICD-10-CM | POA: Diagnosis not present

## 2021-12-04 DIAGNOSIS — G4733 Obstructive sleep apnea (adult) (pediatric): Secondary | ICD-10-CM | POA: Diagnosis not present

## 2021-12-04 DIAGNOSIS — J449 Chronic obstructive pulmonary disease, unspecified: Secondary | ICD-10-CM | POA: Diagnosis not present

## 2021-12-04 DIAGNOSIS — Z87891 Personal history of nicotine dependence: Secondary | ICD-10-CM | POA: Diagnosis not present

## 2022-01-01 DIAGNOSIS — G4733 Obstructive sleep apnea (adult) (pediatric): Secondary | ICD-10-CM | POA: Diagnosis not present

## 2022-01-01 DIAGNOSIS — J449 Chronic obstructive pulmonary disease, unspecified: Secondary | ICD-10-CM | POA: Diagnosis not present

## 2022-01-01 DIAGNOSIS — Z87891 Personal history of nicotine dependence: Secondary | ICD-10-CM | POA: Diagnosis not present

## 2022-02-09 DIAGNOSIS — Z87891 Personal history of nicotine dependence: Secondary | ICD-10-CM | POA: Diagnosis not present

## 2022-02-09 DIAGNOSIS — G4736 Sleep related hypoventilation in conditions classified elsewhere: Secondary | ICD-10-CM | POA: Diagnosis not present

## 2022-02-09 DIAGNOSIS — G4733 Obstructive sleep apnea (adult) (pediatric): Secondary | ICD-10-CM | POA: Diagnosis not present

## 2022-02-09 DIAGNOSIS — J449 Chronic obstructive pulmonary disease, unspecified: Secondary | ICD-10-CM | POA: Diagnosis not present

## 2022-02-22 DIAGNOSIS — L0390 Cellulitis, unspecified: Secondary | ICD-10-CM | POA: Diagnosis not present

## 2022-03-15 DIAGNOSIS — Z87891 Personal history of nicotine dependence: Secondary | ICD-10-CM | POA: Diagnosis not present

## 2022-03-15 DIAGNOSIS — J449 Chronic obstructive pulmonary disease, unspecified: Secondary | ICD-10-CM | POA: Diagnosis not present

## 2022-03-15 DIAGNOSIS — G4736 Sleep related hypoventilation in conditions classified elsewhere: Secondary | ICD-10-CM | POA: Diagnosis not present

## 2022-04-13 DIAGNOSIS — K7460 Unspecified cirrhosis of liver: Secondary | ICD-10-CM | POA: Diagnosis not present

## 2022-04-13 DIAGNOSIS — Z7901 Long term (current) use of anticoagulants: Secondary | ICD-10-CM | POA: Diagnosis not present

## 2022-04-13 DIAGNOSIS — R932 Abnormal findings on diagnostic imaging of liver and biliary tract: Secondary | ICD-10-CM | POA: Diagnosis not present

## 2022-04-20 DIAGNOSIS — R768 Other specified abnormal immunological findings in serum: Secondary | ICD-10-CM | POA: Diagnosis not present

## 2022-04-20 DIAGNOSIS — K7460 Unspecified cirrhosis of liver: Secondary | ICD-10-CM | POA: Diagnosis not present

## 2022-10-14 ENCOUNTER — Encounter: Payer: Self-pay | Admitting: Cardiology

## 2022-11-05 ENCOUNTER — Ambulatory Visit: Payer: Commercial Managed Care - PPO | Admitting: Cardiology

## 2022-11-09 NOTE — Progress Notes (Unsigned)
Cardiology Office Note:    Date:  11/10/2022   ID:  Meagan Lamb, DOB 12-10-1950, MRN [id number]  PCP:  [facility]  Cardiologist:  Norman Herrlich, MD   Referring MD: Marcellus Scott, MD  ASSESSMENT:    1. Bilateral lower extremity edema   2. SOB (shortness of breath)   3. Chronic obstructive pulmonary disease, unspecified COPD type (HCC)   4. Coronary artery calcification seen on CT scan    PLAN:    In order of problems listed above:  Multiple occasions 2018 2021 she has been evaluated for the same problems of lower extremity edema concern of heart failure and shortness of breath with COPD.  I suspect the issues again is not congestive heart failure pulmonary hypertension plan to check N-terminal proBNP as well as echocardiogram and if these did not show evidence of pulmonary hypertension or heart failure I will plan to see back in my office as needed. She tells me she has a prescription for diuretic furosemide that she takes as needed Stable COPD managed by her pulmonologist is now using nocturnal oxygen She has had previous normal ischemia evaluations takes a statin and is not having angina I do not think she requires a repeat cardiac ischemia evaluation at this time  Next appointment as needed depending on the results echocardiogram laboratory test   Medication Adjustments/Labs and Tests Ordered: Current medicines are reviewed at length with the patient today.  Concerns regarding medicines are outlined above.  Orders Placed This Encounter  Procedures   Pro b natriuretic peptide (BNP)   EKG 12-Lead   ECHOCARDIOGRAM COMPLETE   No orders of the defined types were placed in this encounter.     History of Present Illness:    Meagan Lamb is a 72 y.o. female with a history of COPD and morbid obesity who is being seen today for the evaluation of peripheral edema with shortness of breath at the request of Chodri, Eual Fines, MD. she had recent CT of the  chest lung cancer screening showing coronary artery calcification limited to the left circumflex coronary artery pulmonary nodules and emphysema as well as bronchial thickening.  I had seen her in 2018 with a notation that her evaluation including echocardiogram showed no findings of pulmonary artery hypertension or heart failure at that time.  There is also notation she has had a lower extremity venous disease with venous stripping performed.  Chart review shows she had an echocardiogram performed 2021 with normal systolic and diastolic function as well as GLS EF 60 to 65% right ventricle is normal in size and function.  She had a chest CTA performed in July 2018.  There is no finding of pulmonary embolism at that time the coronary arteries were noncalcified.  Over the last few months she has developed worsened edema in her lower extremities Around that time she has noted to have nocturnal hypoxia and uses ambulatory oxygen at nighttime She also took high-dose steroids A history of superficial thrombophlebitis as well as venous ulcerations chronic venous insufficiency and venous stripping surgically many years ago  Her shortness of breath is unchanged and occurs when she walks outdoors it does heavy housework Although she has edema she has no orthopnea chest pain palpitation or syncope Currently taking no medications to favor edema   Past Medical History:  Diagnosis Date   Anxiety    Arthritis    Asthma    Blood transfusion    at 76 months old   Complication  of anesthesia    "takes her a long time to come out of it"   COPD (chronic obstructive pulmonary disease) (HCC)    Depression    GERD (gastroesophageal reflux disease)    H/O hiatal hernia    Headache(784.0)    reoccuring migranes   Hyperlipidemia    Mood disorder (HCC)    Ovarian failure    Peripheral vascular disease (HCC)    vein stripping due to poor circulation   Recurrent upper respiratory infection (URI)    bronchitis  March or April   Shortness of breath    at times per patient   Smoker 08/09/2011   Followed in Pulmonary clinic/ [facility]/ [place]     Stomach ulcer 10/05/2016   Tobacco abuse     Past Surgical History:  Procedure Laterality Date   ANTERIOR LAT LUMBAR FUSION  08/26/2011   Procedure: ANTERIOR LATERAL LUMBAR FUSION 1 LEVEL;  Surgeon: Venita Lick, MD;  Location: [facility] OR;  Service: Orthopedics;  Laterality: N/A;  XLIF WITH POSTERIOR SPINAL FUSION L3-4   APPENDECTOMY     CHOLECYSTECTOMY     GALLBLADDER SURGERY  03/30/2007   HERNIA REPAIR     umbilical   HERNIA REPAIR     LUMBAR DISC SURGERY     LUMBAR FUSION  08/26/2011   L3  L4   TUBAL LIGATION  03/29/1984   VARICOSE VEIN SURGERY      Current Medications: Current Meds  Medication Sig   Acetaminophen (TYLENOL 8 HOUR PO) Take by mouth.   albuterol (VENTOLIN HFA) 108 (90 Base) MCG/ACT inhaler Inhale 1 puff into the lungs every 6 (six) hours as needed for wheezing or shortness of breath.   atorvastatin (LIPITOR) 20 MG tablet Take 20 mg by mouth daily.   Budeson-Glycopyrrol-Formoterol (BREZTRI AEROSPHERE) 160-9-4.8 MCG/ACT AERO Inhale 2 puffs into the lungs daily.   famotidine (PEPCID) 40 MG tablet Take 40 mg by mouth daily.   ibuprofen (ADVIL) 200 MG tablet Take 200 mg by mouth every 6 (six) hours as needed.   QUEtiapine (SEROQUEL) 100 MG tablet Take 100 mg by mouth at bedtime.     Allergies:   Codeine, Adhesive [tape], Aspirin, Bee venom, Clindamycin, Elemental sulfur, Latex, Morphine and codeine, and Penicillins   Social History   Socioeconomic History   Marital status: Married    Spouse name: Not on file   Number of children: Not on file   Years of education: Not on file   Highest education level: Not on file  Occupational History   Not on file  Tobacco Use   Smoking status: Former    Current packs/day: 0.00    Average packs/day: 1.5 packs/day for 44.0 years (66.0 ttl pk-yrs)    Types: Cigarettes    Start  date: 07/1976    Quit date: 07/2020    Years since quitting: 2.2   Smokeless tobacco: Never   Tobacco comments:    Day 14 of [name]  Vaping Use   Vaping status: Never Used  Substance and Sexual Activity   Alcohol use: No   Drug use: No   Sexual activity: Yes  Other Topics Concern   Not on file  Social History Narrative   Not on file   Social Determinants of Health   Financial Resource Strain: Not on file  Food Insecurity: Not on file  Transportation Needs: Not on file  Physical Activity: Not on file  Stress: Not on file  Social Connections: Not on file  Family History: The patient's family history includes Cancer in her mother; Diabetes in her mother; Heart disease in her father; Hypertension in her mother.  ROS:   ROS Please see the history of present illness.     All other systems reviewed and are negative.  EKGs/Labs/Other Studies Reviewed:    The following studies were reviewed today:   Cardiac Studies & Procedures     STRESS TESTS  MYOCARDIAL PERFUSION IMAGING 10/02/2019  Narrative  Nuclear stress EF: 68%.  There was no ST segment deviation noted during stress.  This is a low risk study.  The left ventricular ejection fraction is hyperdynamic (>65%).  Small, mild fixed defect at apex, most consistent with apical thinning. This is better visualized on current study compared to prior. No evidence of ischemia, normal EF. Low risk study.   ECHOCARDIOGRAM  ECHOCARDIOGRAM COMPLETE 09/28/2019  Narrative ECHOCARDIOGRAM REPORT    Patient Name:   Meagan Lamb Date of Exam: 09/28/2019 Medical Rec #:  [id number]         Height:       66.0 in Accession #:    [id number]        Weight:       204.0 lb Date of Birth:  [birth year]         BSA:          2.017 m Patient Age:    69 years          BP:           122/78 mmHg Patient Gender: F                 HR:           77 bpm. Exam Location:  [address]  Procedure: 2D Echo, Cardiac Doppler, Color Doppler  and Strain Analysis  Indications:    R07.9 Chest pain  History:        Patient has prior history of Echocardiogram examinations, most recent 09/28/2016. COPD, Signs/Symptoms:Shortness of Breath; Risk Factors:Current Smoker and Dyslipidemia.  Sonographer:    Garald Braver, RDCS Referring Phys: [id number] Steffanie Dunn  IMPRESSIONS   1. Left ventricular ejection fraction, by estimation, is 60 to 65%. The left ventricle has normal function. The left ventricle has no regional wall motion abnormalities. Left ventricular diastolic parameters were normal. The average left ventricular global longitudinal strain is -20.1 %. 2. Right ventricular systolic function is normal. The right ventricular size is normal. Tricuspid regurgitation signal is inadequate for assessing PA pressure. 3. The mitral valve is normal in structure. No evidence of mitral valve regurgitation. 4. The aortic valve is tricuspid. Aortic valve regurgitation is not visualized. No aortic stenosis is present. 5. The inferior vena cava is normal in size with greater than 50% respiratory variability, suggesting right atrial pressure of 3 mmHg.  Comparison(s): Report only 09/28/16 EF 55-60%.  FINDINGS Left Ventricle: Left ventricular ejection fraction, by estimation, is 60 to 65%. The left ventricle has normal function. The left ventricle has no regional wall motion abnormalities. The average left ventricular global longitudinal strain is -20.1 %. The left ventricular internal cavity size was normal in size. There is no left ventricular hypertrophy. Left ventricular diastolic parameters were normal.  Right Ventricle: The right ventricular size is normal. No increase in right ventricular wall thickness. Right ventricular systolic function is normal. Tricuspid regurgitation signal is inadequate for assessing PA pressure.  Left Atrium: Left atrial size was normal in size.  Right Atrium: Right atrial size  was normal in size. Prominent  Eustachian valve.  Pericardium: There is no evidence of pericardial effusion. Presence of pericardial fat pad.  Mitral Valve: The mitral valve is normal in structure. No evidence of mitral valve regurgitation.  Tricuspid Valve: The tricuspid valve is normal in structure. Tricuspid valve regurgitation is not demonstrated.  Aortic Valve: The aortic valve is tricuspid. Aortic valve regurgitation is not visualized. No aortic stenosis is present.  Pulmonic Valve: The pulmonic valve was not well visualized. Pulmonic valve regurgitation is not visualized.  Aorta: The aortic root and ascending aorta are structurally normal, with no evidence of dilitation.  Venous: The inferior vena cava is normal in size with greater than 50% respiratory variability, suggesting right atrial pressure of 3 mmHg.  IAS/Shunts: No atrial level shunt detected by color flow Doppler.   LEFT VENTRICLE PLAX 2D LVIDd:         4.40 cm  Diastology LVIDs:         2.80 cm  LV e' lateral:   7.73 cm/s LV PW:         0.90 cm  LV E/e' lateral: 9.3 LV IVS:        0.90 cm  LV e' medial:    7.80 cm/s LVOT diam:     2.00 cm  LV E/e' medial:  9.2 LV SV:         77 LV SV Index:   38       2D Longitudinal Strain LVOT Area:     3.14 cm 2D Strain GLS (A2C):   -18.9 % 2D Strain GLS (A3C):   -19.8 % 2D Strain GLS (A4C):   -21.7 % 2D Strain GLS Avg:     -20.1 %  RIGHT VENTRICLE RV Basal diam:  2.90 cm RV S prime:     12.10 cm/s TAPSE (M-mode): 2.7 cm  LEFT ATRIUM             Index       RIGHT ATRIUM           Index LA diam:        3.80 cm 1.88 cm/m  RA Pressure: 3.00 mmHg LA Vol (A2C):   14.6 ml 7.24 ml/m  RA Area:     13.20 cm LA Vol (A4C):   20.7 ml 10.26 ml/m RA Volume:   31.10 ml  15.42 ml/m LA Biplane Vol: 17.6 ml 8.73 ml/m AORTIC VALVE LVOT Vmax:   101.00 cm/s LVOT Vmean:  65.500 cm/s LVOT VTI:    0.246 m  AORTA Ao Root diam: 3.40 cm Ao Asc diam:  3.50 cm  MITRAL VALVE               TRICUSPID  VALVE Estimated RAP:  3.00 mmHg  MV E velocity: 71.90 cm/s  SHUNTS MV A velocity: 60.00 cm/s  Systemic VTI:  0.25 m MV E/A ratio:  1.20        Systemic Diam: 2.00 cm  Epifanio Lesches MD Electronically signed by Epifanio Lesches MD Signature Date/Time: 09/28/2019/4:48:59 PM    Final             EKG Interpretation Date/Time:  Wednesday November 10 2022 10:50:44 EDT Ventricular Rate:  71 PR Interval:  178 QRS Duration:  76 QT Interval:  402 QTC Calculation: 436 R Axis:   58  Text Interpretation: Normal sinus rhythm LOW VOLTAGE Normal ECG When compared with ECG of 20-Aug-2011 09:56, No significant change was found Confirmed by Norman Herrlich ([id number]) on 11/10/2022  10:56:28 AM    07/17/2021 cholesterol 133 triglycerides 129 hemoglobin 12.7 creatinine 1.1  Physical Exam:    VS:  BP (!) 140/80 (BP Location: Right Arm, Patient Position: Sitting, Cuff Size: Normal)   Pulse 71   Ht 5\' 6"  (1.676 m)   Wt 254 lb 12.8 oz (115.6 kg)   SpO2 96%   BMI 41.13 kg/m     Wt Readings from Last 3 Encounters:  11/10/22 254 lb 12.8 oz (115.6 kg)  11/19/20 227 lb 6.4 oz (103.1 kg)  10/02/19 204 lb (92.5 kg)     GEN: COPD habitus well nourished, well developed in no acute distress HEENT: Normal NECK: No JVD; No carotid bruits LYMPHATICS: No lymphadenopathy CARDIAC: Distant heart sounds regular rhythm no ST.  Murmur RESPIRATORY: Hyperinflated diminished breath sounds prolonged expiration but no rales or wheezing ABDOMEN: Soft, non-tender, non-distended MUSCULOSKELETAL: She has stasis changes as well as scar from venous ulceration and marked varicosities as well as 4+ or greater edema above the ankle to the knee bilateral  No deformity  SKIN: Warm and dry NEUROLOGIC:  Alert and oriented x 3 PSYCHIATRIC:  Normal affect     Signed, Norman Herrlich, MD  11/10/2022 11:54 AM    [facility]

## 2022-11-10 ENCOUNTER — Ambulatory Visit: Payer: Commercial Managed Care - PPO | Attending: Cardiology | Admitting: Cardiology

## 2022-11-10 ENCOUNTER — Encounter: Payer: Self-pay | Admitting: Cardiology

## 2022-11-10 VITALS — BP 140/80 | HR 71 | Ht 66.0 in | Wt 254.8 lb

## 2022-11-10 DIAGNOSIS — R0602 Shortness of breath: Secondary | ICD-10-CM | POA: Diagnosis not present

## 2022-11-10 DIAGNOSIS — J449 Chronic obstructive pulmonary disease, unspecified: Secondary | ICD-10-CM

## 2022-11-10 DIAGNOSIS — I2510 Atherosclerotic heart disease of native coronary artery without angina pectoris: Secondary | ICD-10-CM

## 2022-11-10 DIAGNOSIS — R600 Localized edema: Secondary | ICD-10-CM | POA: Diagnosis not present

## 2022-11-10 NOTE — Patient Instructions (Addendum)
Medication Instructions:  Your physician recommends that you continue on your current medications as directed. Please refer to the Current Medication list given to you today.  *If you need a refill on your cardiac medications before your next appointment, please call your pharmacy*   Lab Work: Your physician recommends that you return for lab work in:   Labs today: Pro BNP  If you have labs (blood work) drawn today and your tests are completely normal, you will receive your results only by: MyChart Message (if you have MyChart) OR A paper copy in the mail If you have any lab test that is abnormal or we need to change your treatment, we will call you to review the results.   Testing/Procedures: Your physician has requested that you have an echocardiogram. Echocardiography is a painless test that uses sound waves to create images of your heart. It provides your doctor with information about the size and shape of your heart and how well your heart's chambers and valves are working. This procedure takes approximately one hour. There are no restrictions for this procedure. Please do NOT wear cologne, perfume, aftershave, or lotions (deodorant is allowed). Please arrive 15 minutes prior to your appointment time.    Follow-Up: At [facility], you and your health needs are our priority.  As part of our continuing mission to provide you with exceptional heart care, we have created designated Provider Care Teams.  These Care Teams include your primary Cardiologist (physician) and Advanced Practice Providers (APPs -  Physician Assistants and Nurse Practitioners) who all work together to provide you with the care you need, when you need it.  We recommend signing up for the patient portal called "MyChart".  Sign up information is provided on this After Visit Summary.  MyChart is used to connect with patients for Virtual Visits (Telemedicine).  Patients are able to view lab/test results,  encounter notes, upcoming appointments, etc.  Non-urgent messages can be sent to your provider as well.   To learn more about what you can do with MyChart, go to [email or web address].    Your next appointment:   Follow up as needed   Provider:   Norman Herrlich, MD   Other Instructions None

## 2022-11-11 LAB — PRO B NATRIURETIC PEPTIDE: NT-Pro BNP: 188 pg/mL (ref 0–301)

## 2022-12-10 ENCOUNTER — Ambulatory Visit: Payer: Commercial Managed Care - PPO | Attending: Cardiology

## 2022-12-10 DIAGNOSIS — R0602 Shortness of breath: Secondary | ICD-10-CM | POA: Diagnosis not present

## 2022-12-10 DIAGNOSIS — R600 Localized edema: Secondary | ICD-10-CM

## 2022-12-10 DIAGNOSIS — J449 Chronic obstructive pulmonary disease, unspecified: Secondary | ICD-10-CM | POA: Diagnosis not present

## 2022-12-10 DIAGNOSIS — I2510 Atherosclerotic heart disease of native coronary artery without angina pectoris: Secondary | ICD-10-CM | POA: Diagnosis not present

## 2022-12-10 LAB — ECHOCARDIOGRAM COMPLETE
Area-P 1/2: 3.21 cm2
S' Lateral: 3.5 cm

## 2023-06-07 DIAGNOSIS — I4892 Unspecified atrial flutter: Secondary | ICD-10-CM | POA: Diagnosis not present

## 2023-06-08 DIAGNOSIS — J449 Chronic obstructive pulmonary disease, unspecified: Secondary | ICD-10-CM | POA: Diagnosis not present

## 2023-06-08 DIAGNOSIS — I10 Essential (primary) hypertension: Secondary | ICD-10-CM | POA: Diagnosis not present

## 2023-06-08 DIAGNOSIS — I4892 Unspecified atrial flutter: Secondary | ICD-10-CM | POA: Diagnosis not present

## 2023-06-08 DIAGNOSIS — I2699 Other pulmonary embolism without acute cor pulmonale: Secondary | ICD-10-CM | POA: Diagnosis not present

## 2023-06-30 ENCOUNTER — Encounter: Payer: Self-pay | Admitting: Acute Care

## 2023-07-11 ENCOUNTER — Inpatient Hospital Stay

## 2023-07-11 ENCOUNTER — Telehealth: Payer: Self-pay | Admitting: Physician Assistant

## 2023-07-11 ENCOUNTER — Encounter: Payer: Self-pay | Admitting: Physician Assistant

## 2023-07-11 ENCOUNTER — Inpatient Hospital Stay: Attending: Physician Assistant | Admitting: Physician Assistant

## 2023-07-11 VITALS — BP 152/86 | HR 66 | Temp 97.9°F | Resp 18 | Ht 66.0 in | Wt 251.3 lb

## 2023-07-11 DIAGNOSIS — Z7901 Long term (current) use of anticoagulants: Secondary | ICD-10-CM | POA: Diagnosis not present

## 2023-07-11 DIAGNOSIS — Z86711 Personal history of pulmonary embolism: Secondary | ICD-10-CM

## 2023-07-11 DIAGNOSIS — Z809 Family history of malignant neoplasm, unspecified: Secondary | ICD-10-CM

## 2023-07-11 DIAGNOSIS — Z79899 Other long term (current) drug therapy: Secondary | ICD-10-CM | POA: Insufficient documentation

## 2023-07-11 DIAGNOSIS — Z87891 Personal history of nicotine dependence: Secondary | ICD-10-CM | POA: Diagnosis not present

## 2023-07-11 DIAGNOSIS — J449 Chronic obstructive pulmonary disease, unspecified: Secondary | ICD-10-CM | POA: Insufficient documentation

## 2023-07-11 DIAGNOSIS — I2699 Other pulmonary embolism without acute cor pulmonale: Secondary | ICD-10-CM | POA: Insufficient documentation

## 2023-07-11 DIAGNOSIS — K219 Gastro-esophageal reflux disease without esophagitis: Secondary | ICD-10-CM | POA: Insufficient documentation

## 2023-07-11 LAB — ANTITHROMBIN III: AntiThromb III Func: 98 % (ref 75–120)

## 2023-07-11 LAB — CMP (CANCER CENTER ONLY)
ALT: 10 U/L (ref 0–44)
AST: 18 U/L (ref 15–41)
Albumin: 4.2 g/dL (ref 3.5–5.0)
Alkaline Phosphatase: 50 U/L (ref 38–126)
Anion gap: 12 (ref 5–15)
BUN: 16 mg/dL (ref 8–23)
CO2: 28 mmol/L (ref 22–32)
Calcium: 9.6 mg/dL (ref 8.9–10.3)
Chloride: 102 mmol/L (ref 98–111)
Creatinine: 1.27 mg/dL — ABNORMAL HIGH (ref 0.44–1.00)
GFR, Estimated: 45 mL/min — ABNORMAL LOW (ref >60)
Glucose, Bld: 92 mg/dL (ref 70–99)
Potassium: 4.5 mmol/L (ref 3.5–5.1)
Sodium: 141 mmol/L (ref 135–145)
Total Bilirubin: 0.3 mg/dL (ref 0.0–1.2)
Total Protein: 7.5 g/dL (ref 6.5–8.1)

## 2023-07-11 LAB — CBC WITH DIFFERENTIAL (CANCER CENTER ONLY)
Abs Immature Granulocytes: 0.01 10*3/uL (ref 0.00–0.07)
Basophils Absolute: 0.1 10*3/uL (ref 0.0–0.1)
Basophils Relative: 1 %
Eosinophils Absolute: 0.1 10*3/uL (ref 0.0–0.5)
Eosinophils Relative: 2 %
HCT: 40.4 % (ref 36.0–46.0)
Hemoglobin: 13.1 g/dL (ref 12.0–15.0)
Immature Granulocytes: 0 %
Lymphocytes Relative: 32 %
Lymphs Abs: 1.7 10*3/uL (ref 0.7–4.0)
MCH: 32.1 pg (ref 26.0–34.0)
MCHC: 32.4 g/dL (ref 30.0–36.0)
MCV: 99 fL (ref 80.0–100.0)
Monocytes Absolute: 0.4 10*3/uL (ref 0.1–1.0)
Monocytes Relative: 8 %
Neutro Abs: 3.1 10*3/uL (ref 1.7–7.7)
Neutrophils Relative %: 57 %
Platelet Count: 228 10*3/uL (ref 150–400)
RBC: 4.08 MIL/uL (ref 3.87–5.11)
RDW: 14.5 % (ref 11.5–15.5)
WBC Count: 5.4 10*3/uL (ref 4.0–10.5)
nRBC: 0 % (ref 0.0–0.2)
nRBC: 0 /100{WBCs}

## 2023-07-11 NOTE — Telephone Encounter (Signed)
 Patient has been scheduled for follow-up visit per 07/11/23 LOS.  Pt given an appt calendar with date and time.

## 2023-07-11 NOTE — Progress Notes (Signed)
 [facility]   INITIAL CONSULT NOTE  Patient Care Team: Annamaria Helling, DO as PCP - General (Family Medicine)  CHIEF COMPLAINTS/PURPOSE OF CONSULTATION:  Pulmonary embolism  HISTORY OF PRESENTING ILLNESS:  Meagan Lamb 73 y.o. female with medical history significant for COPD, GERD, hyperlipidemia, arthritis, anxiety and depression presents to the clinic for newly diagnosed pulmonary emboli.  On review of the previous records, Meagan Lamb presented for pre-op visit for planned cataract surgery in March 2025. She was found to have elevated heart rate at 150 bpm.  Patient was admitted on 06/07/2023 once EKG was obtained that showed atrial flutter and started on Cardizem drip.  CTA chest showed a right lower lobe segmental and subsegmental pulmonary emboli.  Patient was started on Eliquis.  On exam today, Meagan Lamb reports that she is tolerating Eliquis therapy without any issues.  She denies easy bruising or any overt signs of bleeding including hematochezia, melena or hematuria.  She denies shortness of breath, palpitations or chest pain.  She reports the cost of the medication is affordable using the co-pay card.  She has chronic varicose veins with history of peripheral vascular disease status post vein stripping.  She denies fevers, chills, sweats, cough, nausea, vomiting or diarrhea.  She has no other complaints.  Rest of the 10 point ROS as below.  MEDICAL HISTORY:  Past Medical History:  Diagnosis Date   Anxiety    Arthritis    Asthma    Blood transfusion    at 35 months old   Complication of anesthesia    "takes her a long time to come out of it"   COPD (chronic obstructive pulmonary disease) (HCC)    Depression    GERD (gastroesophageal reflux disease)    H/O hiatal hernia    Headache(784.0)    reoccuring migranes   Hyperlipidemia    Mood disorder (HCC)    Ovarian failure    Peripheral vascular disease (HCC)    vein stripping due to poor  circulation   Recurrent upper respiratory infection (URI)    bronchitis March or April   Shortness of breath    at times per patient   Smoker 08/09/2011   Followed in Pulmonary clinic/ [facility]/ [facility]     Stomach ulcer 10/05/2016   Tobacco abuse     SURGICAL HISTORY: Past Surgical History:  Procedure Laterality Date   ANTERIOR LAT LUMBAR FUSION  08/26/2011   Procedure: ANTERIOR LATERAL LUMBAR FUSION 1 LEVEL;  Surgeon: Venita Lick, MD;  Location: [facility] OR;  Service: Orthopedics;  Laterality: N/A;  XLIF WITH POSTERIOR SPINAL FUSION L3-4   APPENDECTOMY     CHOLECYSTECTOMY     GALLBLADDER SURGERY  03/30/2007   HERNIA REPAIR     umbilical   HERNIA REPAIR     HIP SURGERY     hip replacement   LUMBAR DISC SURGERY     LUMBAR FUSION  08/26/2011   L3  L4   TUBAL LIGATION  03/29/1984   VARICOSE VEIN SURGERY      SOCIAL HISTORY: Social History   Socioeconomic History   Marital status: Married    Spouse name: Not on file   Number of children: Not on file   Years of education: Not on file   Highest education level: Not on file  Occupational History   Not on file  Tobacco Use   Smoking status: Former    Current packs/day: 0.00    Average packs/day: 1.5 packs/day for  44.0 years (66.0 ttl pk-yrs)    Types: Cigarettes    Start date: 07/1976    Quit date: 07/2020    Years since quitting: 2.9   Smokeless tobacco: Never   Tobacco comments:    Day 14 of [name]  Vaping Use   Vaping status: Never Used  Substance and Sexual Activity   Alcohol use: No    Comment: quit 32 years ago   Drug use: Never   Sexual activity: Yes  Other Topics Concern   Not on file  Social History Narrative   Not on file   Social Drivers of Health   Financial Resource Strain: Not on file  Food Insecurity: No Food Insecurity (07/11/2023)   Hunger Vital Sign    Worried About Running Out of Food in the Last Year: Never true    Ran Out of Food in the Last Year: Never true  Transportation  Needs: No Transportation Needs (07/11/2023)   PRAPARE - Administrator, Civil Service (Medical): No    Lack of Transportation (Non-Medical): No  Physical Activity: Not on file  Stress: Not on file  Social Connections: Not on file  Intimate Partner Violence: Not At Risk (07/11/2023)   Humiliation, Afraid, Rape, and Kick questionnaire    Fear of Current or Ex-Partner: No    Emotionally Abused: No    Physically Abused: No    Sexually Abused: No    FAMILY HISTORY: Family History  Problem Relation Age of Onset   Hypertension Mother    Diabetes Mother    Cancer Mother    Heart disease Father    Deep vein thrombosis Father     ALLERGIES:  is allergic to codeine, adhesive [tape], aspirin, bee venom, clindamycin, elemental sulfur, latex, morphine and codeine, and penicillins.  MEDICATIONS:  Current Outpatient Medications  Medication Sig Dispense Refill   buPROPion (WELLBUTRIN XL) 300 MG 24 hr tablet Take 300 mg by mouth every morning.     diltiazem (CARDIZEM CD) 120 MG 24 hr capsule Take 120 mg by mouth daily.     ELIQUIS 5 MG TABS tablet Take 5 mg by mouth 2 (two) times daily.     gabapentin (NEURONTIN) 300 MG capsule Take 300 mg by mouth at bedtime.     ipratropium-albuterol (DUONEB) 0.5-2.5 (3) MG/3ML SOLN USE 1 VIAL VIA NEBULIZER EVERY 8 HOURS AS NEEDED FOR SHORTNESS OF BREATH     lisinopril (ZESTRIL) 2.5 MG tablet Take 2.5 mg by mouth daily.     Pantoprazole Sodium (INVESTIGATIONAL PANTOPRAZOLE) 40 MG tablet Alliance [id number]      torsemide (DEMADEX) 10 MG tablet Take 10 mg by mouth daily.     TRELEGY ELLIPTA 100-62.5-25 MCG/ACT AEPB Take 1 puff by mouth daily.     Acetaminophen (TYLENOL 8 HOUR PO) Take by mouth.     albuterol (VENTOLIN HFA) 108 (90 Base) MCG/ACT inhaler Inhale 1 puff into the lungs every 6 (six) hours as needed for wheezing or shortness of breath.     Budeson-Glycopyrrol-Formoterol (BREZTRI AEROSPHERE) 160-9-4.8 MCG/ACT AERO Inhale 2 puffs into the lungs  daily.     ibuprofen (ADVIL) 200 MG tablet Take 200 mg by mouth every 6 (six) hours as needed.     QUEtiapine (SEROQUEL) 100 MG tablet Take 100 mg by mouth at bedtime.     No current facility-administered medications for this visit.    REVIEW OF SYSTEMS:   Constitutional: ( - ) fevers, ( - )  chills , ( - ) night  sweats Eyes: ( - ) blurriness of vision, ( - ) double vision, ( - ) watery eyes Ears, nose, mouth, throat, and face: ( - ) mucositis, ( - ) sore throat Respiratory: ( - ) cough, ( - ) dyspnea, ( - ) wheezes Cardiovascular: ( - ) palpitation, ( - ) chest discomfort, ( - ) lower extremity swelling Gastrointestinal:  ( - ) nausea, ( - ) heartburn, ( - ) change in bowel habits Skin: ( - ) abnormal skin rashes Lymphatics: ( - ) new lymphadenopathy, ( - ) easy bruising Neurological: ( - ) numbness, ( - ) tingling, ( - ) new weaknesses Behavioral/Psych: ( - ) mood change, ( - ) new changes  All other systems were reviewed with the patient and are negative.  PHYSICAL EXAMINATION: ECOG PERFORMANCE STATUS: 1 - Symptomatic but completely ambulatory  Vitals:   07/11/23 1313  BP: (!) 152/86  Pulse: 66  Resp: 18  Temp: 97.9 F (36.6 C)  SpO2: 94%   Filed Weights   07/11/23 1313  Weight: 251 lb 4.8 oz (114 kg)    GENERAL: well appearing female in NAD  SKIN: skin color, texture, turgor are normal, no rashes or significant lesions EYES: conjunctiva are pink and non-injected, sclera clear LUNGS: clear to auscultation and percussion with normal breathing effort HEART: regular rate & rhythm and no murmurs. Bilateral stable lower leg edema near the lower calf/ankle with venous stasis skin changes.  Musculoskeletal: no cyanosis of digits and no clubbing  PSYCH: alert & oriented x 3, fluent speech NEURO: no focal motor/sensory deficits  LABORATORY DATA:  I have reviewed the data as listed    Latest Ref Rng & Units 02/28/2017    2:33 PM 08/20/2011    9:31 AM  CBC  WBC 3.4 - 10.8  x10E3/uL 7.7  7.6   Hemoglobin 11.1 - 15.9 g/dL 16.1  09.6   Hematocrit 34.0 - 46.6 % 43.8  47.9   Platelets 150 - 400 K/uL  199        Latest Ref Rng & Units 10/07/2016   12:00 AM 08/20/2011    9:31 AM  CMP  Glucose 65 - 99 mg/dL 85  045   BUN 8 - 27 mg/dL 24  12   Creatinine 4.09 - 1.00 mg/dL 8.11  9.14   Sodium 782 - 144 mmol/L 142  141   Potassium 3.5 - 5.2 mmol/L 4.7  4.0   Chloride 96 - 106 mmol/L 102  101   CO2 20 - 29 mmol/L 26  29   Calcium 8.7 - 10.3 mg/dL 8.8  95.6   Total Protein 6.0 - 8.3 g/dL  7.4   Total Bilirubin 0.3 - 1.2 mg/dL  0.3   Alkaline Phos 39 - 117 U/L  45   AST 0 - 37 U/L  14   ALT 0 - 35 U/L  12     ASSESSMENT & PLAN Meagan Lamb is a 73 y.o. female who presents to the clinic for evaluation of recently diagnosed pulmonary emboli.   A provoked venous thromboembolism (VTE) is one that has a clear inciting factor or event. Provoking factors include prolonged travel/immobility, surgery (particular abdominal or orthropedic), trauma,  and pregnancy/ estrogen containing birth control. After a detailed history and review of the records there is no clear provoking factor for this patient's VTE.  Patients with unprovoked VTEs have up to 25% recurrence after 5 years and 36% at 10 years, with 4% of these clots  being fatal (BMJ [phone]). Therefore the formal recommendation for unprovoked VTE's is lifelong anticoagulation, as the cause may not be transient or reversible. We recommend 6 months or full strength anticoagulation with a re-evaluation after that time.  The patient's will then have a choice of maintenance dose DOAC (preferred, recommended), 81mg  ASA PO daily (non-preferred), or no further anticoagulation (not recommended).   #Unprovoked Pulmonary Embolism --findings at this time are consistent with a unprovoked VTE --will order baseline CMP and CBC to assure labs are adequate for DOAC therapy --will order hypercoagulable workup to evaluate for  underlying clotting disorder.  --recommend the patient continue eliquis 5mg  BID. --patient denies any bleeding, bruising, or dark stools on this medication. It is well tolerated. No difficulties accessing/affording the medication --RTC in 5 months' time with strict return precautions for overt signs of bleeding. Will determine at follow up visit if patient is eligible for maintenance dose of Eliquis.    Orders Placed This Encounter  Procedures   CBC with Differential (Cancer Center Only)    Standing Status:   Future    Expiration Date:   07/10/2024   CMP (Cancer Center only)    Standing Status:   Future    Expiration Date:   07/10/2024   Antithrombin III   Protein C activity   Protein C, total   Protein S activity   Protein S, total   Lupus anticoagulant panel   Beta-2-glycoprotein i abs, IgG/M/A   Homocysteine, serum   Factor 5 leiden   Prothrombin gene mutation   Cardiolipin antibodies, IgG, IgM, IgA    All questions were answered. The patient knows to call the clinic with any problems, questions or concerns.  I have spent a total of 60 minutes minutes of face-to-face and non-face-to-face time, preparing to see the patient, obtaining and/or reviewing separately obtained history, performing a medically appropriate examination, counseling and educating the patient, ordering medications/tests/procedures, documenting clinical information in the electronic health record, independently interpreting results and communicating results to the patient, and care coordination.   Wyline Hearing, PA-C Department of Hematology/Oncology [facility] at [facility]

## 2023-07-12 LAB — LUPUS ANTICOAGULANT PANEL
DRVVT: 106.2 s — ABNORMAL HIGH (ref 0.0–47.0)
PTT Lupus Anticoagulant: 38.3 s (ref 0.0–43.5)

## 2023-07-12 LAB — PROTEIN S ACTIVITY: Protein S Activity: 115 % (ref 63–140)

## 2023-07-12 LAB — HOMOCYSTEINE: Homocysteine: 12.5 umol/L (ref 0.0–19.2)

## 2023-07-12 LAB — PROTEIN S, TOTAL: Protein S Ag, Total: 144 % (ref 60–150)

## 2023-07-12 LAB — PROTEIN C ACTIVITY: Protein C Activity: 131 % (ref 73–180)

## 2023-07-12 LAB — DRVVT MIX: dRVVT Mix: 67.3 s — ABNORMAL HIGH (ref 0.0–40.4)

## 2023-07-12 LAB — DRVVT CONFIRM: dRVVT Confirm: 1.2 ratio (ref 0.8–1.2)

## 2023-07-13 LAB — CARDIOLIPIN ANTIBODIES, IGG, IGM, IGA
Anticardiolipin IgA: <9 U/mL (ref 0–11)
Anticardiolipin IgG: <9 GPL U/mL (ref 0–14)
Anticardiolipin IgM: <9 [MPL'U]/mL (ref 0–12)

## 2023-07-13 LAB — PROTEIN C, TOTAL: Protein C, Total: 103 % (ref 60–150)

## 2023-07-13 LAB — BETA-2-GLYCOPROTEIN I ABS, IGG/M/A
Beta-2 Glyco I IgG: <9 GPI IgG units (ref 0–20)
Beta-2-Glycoprotein I IgA: <9 GPI IgA units (ref 0–25)
Beta-2-Glycoprotein I IgM: <9 GPI IgM units (ref 0–32)

## 2023-07-15 LAB — PROTHROMBIN GENE MUTATION

## 2023-07-15 LAB — FACTOR 5 LEIDEN

## 2023-07-21 ENCOUNTER — Telehealth: Payer: Self-pay | Admitting: Physician Assistant

## 2023-07-21 NOTE — Telephone Encounter (Signed)
 I spoke to [name] to review the labs from 07/11/2023. Hypercoagulable workup was negative for underlying clotting disorder. Her CBC was unremarkable and CMP was unremarkable except for mild elevation of creatinine levels. She is monitoring her creatinine levels with her PCP. I recommended to continue with Eliquis therapy and will need to continue indefinitely has her PE was felt to be provoked.   Meagan Lamb expressed understanding of the plan provided.

## 2023-07-22 ENCOUNTER — Telehealth: Payer: Self-pay | Admitting: Cardiology

## 2023-07-22 NOTE — Telephone Encounter (Signed)
 Can we please call her and check on her?  When Dr. Sandee Crook last saw her he advised she could follow-up on a as needed basis.  She apparently wore a monitor by her PCP, revealing episodes of atrial fibrillation--this was already a known problem, it also revealed pauses however they occurred during traditional sleeping hours and likely there were no symptoms associated with them.  Just would like to make sure she is feeling okay, if not we can schedule her for a follow-up.

## 2023-07-25 ENCOUNTER — Other Ambulatory Visit: Payer: Self-pay

## 2023-07-25 NOTE — Telephone Encounter (Signed)
 Patient is returning call.

## 2023-07-25 NOTE — Telephone Encounter (Signed)
 Called the patient and she reported that she feels fine. She was a little short of breath over the phone but she stated that she has COPD and she see's her lung doctor for her COPD, which is Dr. Dasie Epps.

## 2023-07-25 NOTE — Telephone Encounter (Signed)
 Left message for the patient to call back.

## 2023-07-26 NOTE — Telephone Encounter (Signed)
 Called patient and informed her of Pattricia Bores, NP's recommendation below:  "OK thank you.  No changes at this time then.  She can call us  if she needs anything "  Patient verbalized understanding and had no further questions at this time.

## 2023-09-16 ENCOUNTER — Encounter: Payer: Self-pay | Admitting: *Deleted

## 2023-12-12 ENCOUNTER — Other Ambulatory Visit: Payer: Self-pay | Admitting: Hematology and Oncology

## 2023-12-12 ENCOUNTER — Inpatient Hospital Stay: Attending: Hematology and Oncology | Admitting: Hematology and Oncology

## 2023-12-12 ENCOUNTER — Other Ambulatory Visit: Payer: Self-pay

## 2023-12-12 ENCOUNTER — Inpatient Hospital Stay

## 2023-12-12 ENCOUNTER — Telehealth: Payer: Self-pay | Admitting: Hematology and Oncology

## 2023-12-12 VITALS — BP 147/75 | HR 66 | Temp 98.4°F | Resp 20 | Ht 66.0 in | Wt 251.6 lb

## 2023-12-12 DIAGNOSIS — Z7901 Long term (current) use of anticoagulants: Secondary | ICD-10-CM | POA: Diagnosis not present

## 2023-12-12 DIAGNOSIS — Z79899 Other long term (current) drug therapy: Secondary | ICD-10-CM | POA: Insufficient documentation

## 2023-12-12 DIAGNOSIS — I2699 Other pulmonary embolism without acute cor pulmonale: Secondary | ICD-10-CM | POA: Diagnosis present

## 2023-12-12 DIAGNOSIS — Z86711 Personal history of pulmonary embolism: Secondary | ICD-10-CM | POA: Diagnosis not present

## 2023-12-12 LAB — CMP (CANCER CENTER ONLY)
ALT: 14 U/L (ref 0–44)
AST: 19 U/L (ref 15–41)
Albumin: 4 g/dL (ref 3.5–5.0)
Alkaline Phosphatase: 55 U/L (ref 38–126)
Anion gap: 12 (ref 5–15)
BUN: 18 mg/dL (ref 8–23)
CO2: 26 mmol/L (ref 22–32)
Calcium: 9.5 mg/dL (ref 8.9–10.3)
Chloride: 102 mmol/L (ref 98–111)
Creatinine: 1.32 mg/dL — ABNORMAL HIGH (ref 0.44–1.00)
GFR, Estimated: 43 mL/min — ABNORMAL LOW (ref >60)
Glucose, Bld: 100 mg/dL — ABNORMAL HIGH (ref 70–99)
Potassium: 4.2 mmol/L (ref 3.5–5.1)
Sodium: 140 mmol/L (ref 135–145)
Total Bilirubin: 0.3 mg/dL (ref 0.0–1.2)
Total Protein: 7.7 g/dL (ref 6.5–8.1)

## 2023-12-12 LAB — CBC WITH DIFFERENTIAL (CANCER CENTER ONLY)
Abs Immature Granulocytes: 0.01 K/uL (ref 0.00–0.07)
Basophils Absolute: 0.1 K/uL (ref 0.0–0.1)
Basophils Relative: 1 %
Eosinophils Absolute: 0.2 K/uL (ref 0.0–0.5)
Eosinophils Relative: 3 %
HCT: 40.5 % (ref 36.0–46.0)
Hemoglobin: 12.6 g/dL (ref 12.0–15.0)
Immature Granulocytes: 0 %
Lymphocytes Relative: 34 %
Lymphs Abs: 2.2 K/uL (ref 0.7–4.0)
MCH: 31.1 pg (ref 26.0–34.0)
MCHC: 31.1 g/dL (ref 30.0–36.0)
MCV: 100 fL (ref 80.0–100.0)
Monocytes Absolute: 0.5 K/uL (ref 0.1–1.0)
Monocytes Relative: 8 %
Neutro Abs: 3.5 K/uL (ref 1.7–7.7)
Neutrophils Relative %: 54 %
Platelet Count: 215 K/uL (ref 150–400)
RBC: 4.05 MIL/uL (ref 3.87–5.11)
RDW: 15 % (ref 11.5–15.5)
WBC Count: 6.4 K/uL (ref 4.0–10.5)
nRBC: 0 % (ref 0.0–0.2)

## 2023-12-12 NOTE — Telephone Encounter (Signed)
 Patient has been scheduled for follow-up visit per 12/12/23 LOS.  Pt given an appt calendar with date and time.

## 2023-12-12 NOTE — Progress Notes (Signed)
 [facility]   INITIAL CONSULT NOTE  Patient Care Team: Conley Dene BROCKS, DO as PCP - General (Family Medicine)  CHIEF COMPLAINTS/PURPOSE OF CONSULTATION:  Pulmonary embolism  HISTORY OF PRESENTING ILLNESS:  Meagan Lamb 73 y.o. female with medical history significant for COPD, GERD, hyperlipidemia, arthritis, anxiety and depression presents to the clinic for newly diagnosed pulmonary emboli.  On review of the previous records, Meagan Lamb presented for pre-op visit for planned cataract surgery in March 2025. She was found to have elevated heart rate at 150 bpm.  Patient was admitted on 06/07/2023 once EKG was obtained that showed atrial flutter and started on Cardizem drip.  CTA chest showed a right lower lobe segmental and subsegmental pulmonary emboli.  Patient was started on Eliquis.  On exam today, Meagan Lamb reports that she is tolerating Eliquis therapy without any issues.  She denies easy bruising or any overt signs of bleeding including hematochezia, melena or hematuria.  She denies shortness of breath, palpitations or chest pain.  She reports the cost of the medication is affordable using the co-pay card.  She has chronic varicose veins with history of peripheral vascular disease status post vein stripping.  She denies fevers, chills, sweats, cough, nausea, vomiting or diarrhea.  She has no other complaints.  Rest of the 10 point ROS as below.  MEDICAL HISTORY:  Past Medical History:  Diagnosis Date   Anxiety    Arthritis    Asthma    Blood transfusion    at 59 months old   Complication of anesthesia    takes her a long time to come out of it   COPD (chronic obstructive pulmonary disease) (HCC)    Depression    GERD (gastroesophageal reflux disease)    H/O hiatal hernia    Headache(784.0)    reoccuring migranes   Hyperlipidemia    Mood disorder (HCC)    Ovarian failure    Peripheral vascular disease (HCC)    vein stripping due to poor  circulation   Recurrent upper respiratory infection (URI)    bronchitis March or April   Shortness of breath    at times per patient   Smoker 08/09/2011   Followed in Pulmonary clinic/ [facility]/ [facility]     Stomach ulcer 10/05/2016   Tobacco abuse     SURGICAL HISTORY: Past Surgical History:  Procedure Laterality Date   ANTERIOR LAT LUMBAR FUSION  08/26/2011   Procedure: ANTERIOR LATERAL LUMBAR FUSION 1 LEVEL;  Surgeon: Donaciano Sprang, MD;  Location: [facility] OR;  Service: Orthopedics;  Laterality: N/A;  XLIF WITH POSTERIOR SPINAL FUSION L3-4   APPENDECTOMY     CHOLECYSTECTOMY     GALLBLADDER SURGERY  03/30/2007   HERNIA REPAIR     umbilical   HERNIA REPAIR     HIP SURGERY     hip replacement   LUMBAR DISC SURGERY     LUMBAR FUSION  08/26/2011   L3  L4   TUBAL LIGATION  03/29/1984   VARICOSE VEIN SURGERY      SOCIAL HISTORY: Social History   Socioeconomic History   Marital status: Married    Spouse name: Not on file   Number of children: Not on file   Years of education: Not on file   Highest education level: Not on file  Occupational History   Not on file  Tobacco Use   Smoking status: Former    Current packs/day: 0.00    Average packs/day: 1.5 packs/day for  44.0 years (66.0 ttl pk-yrs)    Types: Cigarettes    Start date: 07/1976    Quit date: 07/2020    Years since quitting: 3.3   Smokeless tobacco: Never   Tobacco comments:    Day 14 of [name]   Vaping Use   Vaping status: Never Used  Substance and Sexual Activity   Alcohol use: No    Comment: quit 32 years ago   Drug use: Never   Sexual activity: Yes  Other Topics Concern   Not on file  Social History Narrative   Not on file   Social Drivers of Health   Financial Resource Strain: Not on file  Food Insecurity: No Food Insecurity (07/11/2023)   Hunger Vital Sign    Worried About Running Out of Food in the Last Year: Never true    Ran Out of Food in the Last Year: Never true  Transportation  Needs: No Transportation Needs (07/11/2023)   PRAPARE - Administrator, Civil Service (Medical): No    Lack of Transportation (Non-Medical): No  Physical Activity: Not on file  Stress: Not on file  Social Connections: Not on file  Intimate Partner Violence: Not At Risk (07/11/2023)   Humiliation, Afraid, Rape, and Kick questionnaire    Fear of Current or Ex-Partner: No    Emotionally Abused: No    Physically Abused: No    Sexually Abused: No    FAMILY HISTORY: Family History  Problem Relation Age of Onset   Hypertension Mother    Diabetes Mother    Cancer Mother    Heart disease Father    Deep vein thrombosis Father     ALLERGIES:  is allergic to codeine, adhesive [tape], aspirin, bee venom, clindamycin, elemental sulfur, latex, morphine and codeine, and penicillins.  MEDICATIONS:  Current Outpatient Medications  Medication Sig Dispense Refill   Acetaminophen  (TYLENOL  8 HOUR PO) Take by mouth.     albuterol  (VENTOLIN  HFA) 108 (90 Base) MCG/ACT inhaler Inhale 1 puff into the lungs every 6 (six) hours as needed for wheezing or shortness of breath.     Budeson-Glycopyrrol-Formoterol  (BREZTRI  AEROSPHERE) 160-9-4.8 MCG/ACT AERO Inhale 2 puffs into the lungs daily.     buPROPion  (WELLBUTRIN  XL) 300 MG 24 hr tablet Take 300 mg by mouth every morning.     diltiazem (CARDIZEM CD) 120 MG 24 hr capsule Take 120 mg by mouth daily.     ELIQUIS 5 MG TABS tablet Take 5 mg by mouth 2 (two) times daily.     gabapentin (NEURONTIN) 300 MG capsule Take 300 mg by mouth at bedtime.     ibuprofen (ADVIL) 200 MG tablet Take 200 mg by mouth every 6 (six) hours as needed.     ipratropium-albuterol  (DUONEB) 0.5-2.5 (3) MG/3ML SOLN USE 1 VIAL VIA NEBULIZER EVERY 8 HOURS AS NEEDED FOR SHORTNESS OF BREATH     lisinopril (ZESTRIL) 2.5 MG tablet Take 2.5 mg by mouth daily.     Pantoprazole  Sodium (INVESTIGATIONAL PANTOPRAZOLE ) 40 MG tablet Alliance [id number]      QUEtiapine (SEROQUEL) 100 MG tablet  Take 100 mg by mouth at bedtime.     torsemide (DEMADEX) 10 MG tablet Take 10 mg by mouth daily.     TRELEGY ELLIPTA  100-62.5-25 MCG/ACT AEPB Take 1 puff by mouth daily.     No current facility-administered medications for this visit.    REVIEW OF SYSTEMS:   Constitutional: ( - ) fevers, ( - )  chills , ( - ) night  sweats Eyes: ( - ) blurriness of vision, ( - ) double vision, ( - ) watery eyes Ears, nose, mouth, throat, and face: ( - ) mucositis, ( - ) sore throat Respiratory: ( - ) cough, ( - ) dyspnea, ( - ) wheezes Cardiovascular: ( - ) palpitation, ( - ) chest discomfort, ( - ) lower extremity swelling Gastrointestinal:  ( - ) nausea, ( - ) heartburn, ( - ) change in bowel habits Skin: ( - ) abnormal skin rashes Lymphatics: ( - ) new lymphadenopathy, ( - ) easy bruising Neurological: ( - ) numbness, ( - ) tingling, ( - ) new weaknesses Behavioral/Psych: ( - ) mood change, ( - ) new changes  All other systems were reviewed with the patient and are negative.  PHYSICAL EXAMINATION: ECOG PERFORMANCE STATUS: 1 - Symptomatic but completely ambulatory  There were no vitals filed for this visit.  There were no vitals filed for this visit.   GENERAL: well appearing female in NAD  SKIN: skin color, texture, turgor are normal, no rashes or significant lesions EYES: conjunctiva are pink and non-injected, sclera clear LUNGS: clear to auscultation and percussion with normal breathing effort HEART: regular rate & rhythm and no murmurs. Bilateral stable lower leg edema near the lower calf/ankle with venous stasis skin changes.  Musculoskeletal: no cyanosis of digits and no clubbing  PSYCH: alert & oriented x 3, fluent speech NEURO: no focal motor/sensory deficits  LABORATORY DATA:  I have reviewed the data as listed    Latest Ref Rng & Units 12/12/2023    1:32 PM 07/11/2023    2:02 PM 02/28/2017    2:33 PM  CBC  WBC 4.0 - 10.5 K/uL 6.4  5.4  7.7   Hemoglobin 12.0 - 15.0 g/dL 87.3  86.8   85.1   Hematocrit 36.0 - 46.0 % 40.5  40.4  43.8   Platelets 150 - 400 K/uL 215  228         Latest Ref Rng & Units 07/11/2023    2:02 PM 10/07/2016   12:00 AM 08/20/2011    9:31 AM  CMP  Glucose 70 - 99 mg/dL 92  85  895   BUN 8 - 23 mg/dL 16  24  12    Creatinine 0.44 - 1.00 mg/dL 8.72  8.79  9.24   Sodium 135 - 145 mmol/L 141  142  141   Potassium 3.5 - 5.1 mmol/L 4.5  4.7  4.0   Chloride 98 - 111 mmol/L 102  102  101   CO2 22 - 32 mmol/L 28  26  29    Calcium 8.9 - 10.3 mg/dL 9.6  8.8  89.9   Total Protein 6.5 - 8.1 g/dL 7.5   7.4   Total Bilirubin 0.0 - 1.2 mg/dL 0.3   0.3   Alkaline Phos 38 - 126 U/L 50   45   AST 15 - 41 U/L 18   14   ALT 0 - 44 U/L 10   12     ASSESSMENT & PLAN Meagan Lamb is a 73 y.o. female who presents to the clinic for evaluation of recently diagnosed pulmonary emboli.   A provoked venous thromboembolism (VTE) is one that has a clear inciting factor or event. Provoking factors include prolonged travel/immobility, surgery (particular abdominal or orthropedic), trauma,  and pregnancy/ estrogen containing birth control. After a detailed history and review of the records there is no clear provoking factor for this patient's VTE.  Patients with unprovoked VTEs have up to 25% recurrence after 5 years and 36% at 10 years, with 4% of these clots being fatal (BMJ [phone]). Therefore the formal recommendation for unprovoked VTE's is lifelong anticoagulation, as the cause may not be transient or reversible. We recommend 6 months or full strength anticoagulation with a re-evaluation after that time.  The patient's will then have a choice of maintenance dose DOAC (preferred, recommended), 81mg  ASA PO daily (non-preferred), or no further anticoagulation (not recommended).   #Unprovoked Pulmonary Embolism Doing well on Eliquis. Hypercoag panel negative. She remains asymptomatic with no new concerns.We will plan to see her back in 6 months, sooner if needed.     No orders of the defined types were placed in this encounter.   All questions were answered. The patient knows to call the clinic with any problems, questions or concerns.  I have spent a total of 20 minutes minutes of face-to-face and non-face-to-face time, preparing to see the patient, obtaining and/or reviewing separately obtained history, performing a medically appropriate examination, counseling and educating the patient, ordering medications/tests/procedures, documenting clinical information in the electronic health record, independently interpreting results and communicating results to the patient, and care coordination.   Eleanor Bach, FNP- [facility] Department of Hematology/Oncology [facility] at [facility]

## 2024-06-11 ENCOUNTER — Other Ambulatory Visit

## 2024-06-11 ENCOUNTER — Ambulatory Visit: Admitting: Hematology and Oncology
